# Patient Record
Sex: Female | Born: 1945 | Hispanic: No | Marital: Married | Smoking: Never smoker
Health system: Southern US, Community
[De-identification: ages and names within clinical notes are randomized; demographics above are authoritative.]

## PROBLEM LIST (undated history)

## (undated) DIAGNOSIS — K5790 Diverticulosis of intestine, part unspecified, without perforation or abscess without bleeding: Secondary | ICD-10-CM

## (undated) DIAGNOSIS — K529 Noninfective gastroenteritis and colitis, unspecified: Secondary | ICD-10-CM

## (undated) DIAGNOSIS — M25551 Pain in right hip: Secondary | ICD-10-CM

## (undated) HISTORY — PX: FOOT SURGERY: SHX648

## (undated) HISTORY — DX: Noninfective gastroenteritis and colitis, unspecified: K52.9

## (undated) HISTORY — PX: FINGER SURGERY: SHX640

## (undated) HISTORY — DX: Diverticulosis of intestine, part unspecified, without perforation or abscess without bleeding: K57.90

---

## 1964-10-09 HISTORY — PX: DILATION AND CURETTAGE OF UTERUS: SHX78

## 1997-10-09 HISTORY — PX: BREAST LUMPECTOMY: SHX2

## 2002-02-25 ENCOUNTER — Other Ambulatory Visit: Admission: RE | Admit: 2002-02-25 | Discharge: 2002-02-25 | Payer: Self-pay | Admitting: Obstetrics and Gynecology

## 2004-09-19 ENCOUNTER — Ambulatory Visit: Payer: Self-pay

## 2006-09-10 ENCOUNTER — Ambulatory Visit: Payer: Self-pay | Admitting: Obstetrics and Gynecology

## 2007-09-12 ENCOUNTER — Ambulatory Visit: Payer: Self-pay | Admitting: Obstetrics and Gynecology

## 2009-09-07 ENCOUNTER — Ambulatory Visit: Payer: Self-pay | Admitting: Internal Medicine

## 2009-10-09 HISTORY — PX: SPINE SURGERY: SHX786

## 2009-11-29 ENCOUNTER — Ambulatory Visit: Payer: Self-pay | Admitting: Internal Medicine

## 2010-07-14 ENCOUNTER — Encounter: Payer: Self-pay | Admitting: Internal Medicine

## 2010-08-09 ENCOUNTER — Encounter: Payer: Self-pay | Admitting: Internal Medicine

## 2010-10-09 DIAGNOSIS — K529 Noninfective gastroenteritis and colitis, unspecified: Secondary | ICD-10-CM

## 2010-10-09 HISTORY — DX: Noninfective gastroenteritis and colitis, unspecified: K52.9

## 2011-01-05 ENCOUNTER — Ambulatory Visit: Payer: Self-pay | Admitting: Family Medicine

## 2011-01-25 ENCOUNTER — Encounter: Payer: Self-pay | Admitting: Family Medicine

## 2011-02-07 ENCOUNTER — Encounter: Payer: Self-pay | Admitting: Family Medicine

## 2011-04-11 ENCOUNTER — Ambulatory Visit: Payer: Self-pay | Admitting: Gastroenterology

## 2011-04-14 LAB — PATHOLOGY REPORT

## 2011-06-27 ENCOUNTER — Telehealth: Payer: Self-pay | Admitting: Gastroenterology

## 2011-06-28 NOTE — Telephone Encounter (Signed)
Ok

## 2011-06-28 NOTE — Telephone Encounter (Signed)
Pt has been seen by Dr. Bluford Kaufmann in [place]. She was not pleased with the care she received there and is requesting to switch to Dr. Arlyce Dice. She states her primary care md suggested Dr. Arlyce Dice. Colon path report to Dr. Arlyce Dice to review. Please let me know if you will see this pt.

## 2011-06-29 ENCOUNTER — Telehealth: Payer: Self-pay | Admitting: Gastroenterology

## 2011-06-29 NOTE — Telephone Encounter (Signed)
Pt was seen by Dr. Bluford Kaufmann in [place]. Dr. Arlyce Dice has agreed to take her on as a pt. Pt is complaining of abdominal pain and diarrhea. Requesting to be seen. Pt scheduled to see Willette Cluster NP 06/30/11@9 :30am. Instructed pts husband to bring her medical records with them to the visit.

## 2011-06-30 ENCOUNTER — Other Ambulatory Visit: Payer: Self-pay | Admitting: Nurse Practitioner

## 2011-06-30 ENCOUNTER — Encounter: Payer: Self-pay | Admitting: Nurse Practitioner

## 2011-06-30 ENCOUNTER — Other Ambulatory Visit (INDEPENDENT_AMBULATORY_CARE_PROVIDER_SITE_OTHER): Payer: Medicare Other

## 2011-06-30 ENCOUNTER — Ambulatory Visit (INDEPENDENT_AMBULATORY_CARE_PROVIDER_SITE_OTHER): Payer: Medicare Other | Admitting: Nurse Practitioner

## 2011-06-30 VITALS — BP 128/72 | HR 60 | Ht 65.0 in | Wt 160.0 lb

## 2011-06-30 DIAGNOSIS — R1030 Lower abdominal pain, unspecified: Secondary | ICD-10-CM | POA: Insufficient documentation

## 2011-06-30 DIAGNOSIS — F458 Other somatoform disorders: Secondary | ICD-10-CM

## 2011-06-30 DIAGNOSIS — R109 Unspecified abdominal pain: Secondary | ICD-10-CM

## 2011-06-30 DIAGNOSIS — R198 Other specified symptoms and signs involving the digestive system and abdomen: Secondary | ICD-10-CM

## 2011-06-30 DIAGNOSIS — R0989 Other specified symptoms and signs involving the circulatory and respiratory systems: Secondary | ICD-10-CM

## 2011-06-30 DIAGNOSIS — K5289 Other specified noninfective gastroenteritis and colitis: Secondary | ICD-10-CM

## 2011-06-30 DIAGNOSIS — R194 Change in bowel habit: Secondary | ICD-10-CM

## 2011-06-30 DIAGNOSIS — K529 Noninfective gastroenteritis and colitis, unspecified: Secondary | ICD-10-CM

## 2011-06-30 DIAGNOSIS — K5790 Diverticulosis of intestine, part unspecified, without perforation or abscess without bleeding: Secondary | ICD-10-CM

## 2011-06-30 DIAGNOSIS — K5730 Diverticulosis of large intestine without perforation or abscess without bleeding: Secondary | ICD-10-CM

## 2011-06-30 DIAGNOSIS — R09A2 Foreign body sensation, throat: Secondary | ICD-10-CM

## 2011-06-30 DIAGNOSIS — D699 Hemorrhagic condition, unspecified: Secondary | ICD-10-CM

## 2011-06-30 LAB — URINALYSIS, ROUTINE W REFLEX MICROSCOPIC
Bilirubin Urine: NEGATIVE
Ketones, ur: NEGATIVE
Total Protein, Urine: NEGATIVE
Urine Glucose: NEGATIVE
Urobilinogen, UA: 0.2 (ref 0.0–1.0)
pH: 7 (ref 5.0–8.0)

## 2011-06-30 LAB — COMPREHENSIVE METABOLIC PANEL
Alkaline Phosphatase: 97 U/L (ref 39–117)
Creatinine, Ser: 1 mg/dL (ref 0.4–1.2)
GFR: 61.8 mL/min (ref >60.00)
Glucose, Bld: 84 mg/dL (ref 70–99)
Sodium: 139 mEq/L (ref 135–145)
Total Bilirubin: 1.6 mg/dL — ABNORMAL HIGH (ref 0.3–1.2)
Total Protein: 7.3 g/dL (ref 6.0–8.3)

## 2011-06-30 LAB — CBC WITH DIFFERENTIAL/PLATELET
Eosinophils Relative: 1.3 % (ref 0.0–5.0)
HCT: 46.3 % — ABNORMAL HIGH (ref 36.0–46.0)
Hemoglobin: 15.4 g/dL — ABNORMAL HIGH (ref 12.0–15.0)
Lymphs Abs: 0.9 10*3/uL (ref 0.7–4.0)
MCV: 87.1 fl (ref 78.0–100.0)
Monocytes Absolute: 0.4 10*3/uL (ref 0.1–1.0)
Neutro Abs: 6 10*3/uL (ref 1.4–7.7)
Platelets: 275 10*3/uL (ref 150.0–400.0)
RDW: 14.5 % (ref 11.5–14.6)
WBC: 7.4 10*3/uL (ref 4.5–10.5)

## 2011-06-30 LAB — SEDIMENTATION RATE: Sed Rate: 10 mm/hr (ref 0–22)

## 2011-06-30 MED ORDER — OMEPRAZOLE 20 MG PO CPDR: DELAYED_RELEASE_CAPSULE | ORAL | Status: DC

## 2011-06-30 NOTE — Progress Notes (Signed)
06/30/2011 Tammie Cervantes [id number] Feb 13, 1946   HISTORY OF PRESENT ILLNESS: Patient is a 65 year old white female, new to this practice, here for evaluation of colitis. Patient had a colon cancer screening in July of this year in [place].  She has been having some cramps, rectal pressure and loose stools (small volume) for 6 months but didn't think much of it until her colonoscopy showed colitis. Patient apparently had difficulty getting a follow up appointment to discuss test results and she has chosen not to return to the [facility].Since her colonoscopy patient has developed intermittent RLQ discomfort. The discomfort is unrelated to meals or defecation. Since her colonoscopy patient has been on a high fiber diet and her bowel movements have nearly returned to baseline .No weight loss. No skin rashes / lesions, no unusual joint pains.   Patient describes sensation of something being stuck in her throat but denies dysphagia. No heartburn. No history of significant GERD symptoms.    Past Medical History  Diagnosis Date  . Diverticulosis 2012    Colonoscopy--[facility]  . Colitis 2012    Colonoscopy--[facility]  . Back spasm   . Constipation    Past Surgical History  Procedure Date  . Breast lumpectomy 1999    Right  . Foot surgery     Right x 5  . Spine surgery 2011  . Dilation and curettage of uterus 1966    reports that she has never smoked. She has never used smokeless tobacco. She reports that she does not drink alcohol or use illicit drugs. family history includes Crohn's disease in her cousin and Heart disease in her mother.  There is no history of Colon cancer. Allergies  Allergen Reactions  . Betadine (Povidone Iodine)     Rash   . Compazine   . Epinephrine   . Iodinated Diagnostic Agents       Outpatient Encounter Prescriptions as of 06/30/2011  Medication Sig Dispense Refill  . AMBULATORY NON FORMULARY MEDICATION Iodine Supplement --- One by mouth once  daily       . AMBULATORY NON FORMULARY MEDICATION Potassium Tablet-- One tablet by mouth once daily       . Calcium-Magnesium-Vitamin D (CALCIUM MAGNESIUM PO) Take 1 capsule by mouth daily.        . Multiple Vitamin (MULTIVITAMIN PO) Take 1 capsule by mouth daily.        . Multiple Vitamins-Minerals (ZINC PO) Take 1 tablet by mouth daily.        . Omega-3 Fatty Acids (OMEGA 3 PO) Take 1 tablet by mouth daily.        . psyllium (METAMUCIL) 0.52 G capsule Take by mouth. 2-3 capsule by mouth twice a day       . VITAMIN D, CHOLECALCIFEROL, PO Take 1 capsule by mouth daily.         REVIEW OF SYSTEMS  : Positive for back pain, fatigue, frequent urination, urinary leakage. Positive for excessive bleeding with surgeries but denies easy bruisability. All other systems reviewed and negative except where noted in the History of Present Illness.   PHYSICAL EXAM: General: Well developed white female in no acute distress Head: Normocephalic and atraumatic Eyes:  sclerae anicteric,conjunctive pink. Ears: Normal auditory acuity Mouth: No deformity or lesions Neck: Supple, no masses.  Lungs: Clear throughout to auscultation Heart: Regular rate and rhythm; no murmurs heard Abdomen: Soft, non distended, non tender. No masses or hepatomegaly noted. Normal Bowel sounds Rectal: not done  Musculoskeletal: Symmetrical with no gross deformities  Skin: No lesions on visible extremities Extremities: No edema or deformities noted Neurological: Alert oriented x 4, grossly non focal Cervical Nodes:  No significant cervical adenopathy Psychological:  Alert and cooperative. Normal mood and affect  ASSESSMENT AND PLAN;

## 2011-07-01 DIAGNOSIS — K5790 Diverticulosis of intestine, part unspecified, without perforation or abscess without bleeding: Secondary | ICD-10-CM | POA: Insufficient documentation

## 2011-07-01 DIAGNOSIS — D699 Hemorrhagic condition, unspecified: Secondary | ICD-10-CM | POA: Insufficient documentation

## 2011-07-01 LAB — C-REACTIVE PROTEIN: CRP: 0.24 mg/dL (ref <0.60)

## 2011-07-01 NOTE — Assessment & Plan Note (Signed)
Please refer to "colitis"

## 2011-07-01 NOTE — Assessment & Plan Note (Signed)
Colonoscopy 04/11/11 in [place] revealed a localized area of mildlly eroded and inflamed sigmoid mucosa. Biopsies revealed marked, active colitis with features of chronicity but they were inconclusive with ischemia, infection and inflammatory bowel disease all in the list of possibilities. Stools nearly normal after initiation of fiber supplements but lower abdominal discomfort persists without any relationship to food or defecation. For further evaluation the patient will be scheduled for a flexible sigmoidoscopy, probably with repeat biopsies of the sigmoid colon. The risks, benefits, and alternatives to flexible sigmoidoscopy with possible biopsies were discussed with the patient and she consents to proceed.

## 2011-07-01 NOTE — Assessment & Plan Note (Signed)
Sigmoid diverticulosis on July 2012 colonoscopy.

## 2011-07-01 NOTE — Assessment & Plan Note (Signed)
Patient gives a history of abnormal bleeding with previous surgeries. Her INR was checked prior to colonoscopy in July, patient tells me it was normal.

## 2011-07-03 ENCOUNTER — Telehealth: Payer: Self-pay | Admitting: *Deleted

## 2011-07-03 ENCOUNTER — Encounter: Payer: Self-pay | Admitting: Nurse Practitioner

## 2011-07-03 DIAGNOSIS — R09A2 Foreign body sensation, throat: Secondary | ICD-10-CM | POA: Insufficient documentation

## 2011-07-03 DIAGNOSIS — R0989 Other specified symptoms and signs involving the circulatory and respiratory systems: Secondary | ICD-10-CM | POA: Insufficient documentation

## 2011-07-03 MED ORDER — CIPROFLOXACIN HCL 500 MG PO TABS: ORAL_TABLET | ORAL | Status: DC

## 2011-07-03 NOTE — Assessment & Plan Note (Signed)
New onset of lower abdominal discomfort, urinary pressure. This may be of gastrointestinal origin but need to exclude UTI. Will check UA and C&S.

## 2011-07-03 NOTE — Progress Notes (Signed)
Reviewed and agree with management. Robert D. Kaplan, M.D., FACG  

## 2011-07-03 NOTE — Patient Instructions (Addendum)
Please go to the basement level to have your labs drawn.  We have scheduled the Flexible Sigmoidoscopy with Dr. Melvia Heaps.  Directions and brochure provided.

## 2011-07-03 NOTE — Telephone Encounter (Signed)
Spoke with Willette Cluster, NP Re; Urine culture. Patient needs Cipro 500 mg po daily x 3 days. Spoke with patient and she is aware.

## 2011-07-03 NOTE — Assessment & Plan Note (Signed)
No dysphagia but feels like something stuck in her throat. This may be globus sensation secondary to GERD. Will try a PPI, if no improvement can evaluate further with upper endoscopy of barium swallow.

## 2011-07-04 ENCOUNTER — Telehealth: Payer: Self-pay | Admitting: Internal Medicine

## 2011-07-04 ENCOUNTER — Telehealth: Payer: Self-pay | Admitting: Gastroenterology

## 2011-07-04 MED ORDER — SULFAMETHOXAZOLE-TRIMETHOPRIM 800-160 MG PO TABS: 1.0000 | ORAL_TABLET | Freq: Two times a day (BID) | ORAL | Status: AC

## 2011-07-04 NOTE — Telephone Encounter (Signed)
Pt aware and script sent to pharmacy. 

## 2011-07-04 NOTE — Telephone Encounter (Signed)
I called and spoke with katie at [facility].  She says the patient did not want Cipro she wanted bactoban.  I have attempted to reach the patient and left a message for her to call back.  I have also left her a message that she should pick up Cipro and start on it due to UTI.

## 2011-07-04 NOTE — Telephone Encounter (Signed)
Bactrim 1 DS bid for 5 days

## 2011-07-04 NOTE — Telephone Encounter (Signed)
Pt was seen by Willette Cluster NP and given Cipro to take for a UTI. Pt states she cannot take the Cipro. Pt is requesting either Bactrim or Macrobid. Dr. Arlyce Dice please advise.

## 2011-07-05 NOTE — Telephone Encounter (Signed)
See additional phone from 07/04/11 patient changed to Bactrim

## 2011-07-10 ENCOUNTER — Other Ambulatory Visit: Payer: Medicare Other | Admitting: Gastroenterology

## 2011-07-10 ENCOUNTER — Telehealth: Payer: Self-pay | Admitting: Internal Medicine

## 2011-07-10 ENCOUNTER — Ambulatory Visit (AMBULATORY_SURGERY_CENTER): Payer: Medicare Other | Admitting: Gastroenterology

## 2011-07-10 ENCOUNTER — Encounter: Payer: Self-pay | Admitting: Gastroenterology

## 2011-07-10 DIAGNOSIS — R109 Unspecified abdominal pain: Secondary | ICD-10-CM

## 2011-07-10 DIAGNOSIS — K5730 Diverticulosis of large intestine without perforation or abscess without bleeding: Secondary | ICD-10-CM

## 2011-07-10 DIAGNOSIS — R198 Other specified symptoms and signs involving the digestive system and abdomen: Secondary | ICD-10-CM

## 2011-07-10 DIAGNOSIS — K5289 Other specified noninfective gastroenteritis and colitis: Secondary | ICD-10-CM

## 2011-07-10 MED ORDER — SODIUM CHLORIDE 0.9 % IV SOLN: 500.0000 mL | INTRAVENOUS | Status: DC

## 2011-07-10 MED ORDER — HYOSCYAMINE SULFATE 0.125 MG SL SUBL: 0.2500 mg | SUBLINGUAL_TABLET | SUBLINGUAL | Status: DC | PRN

## 2011-07-10 NOTE — Telephone Encounter (Signed)
I changed her Rx to the [facility] Please let [facility] know that Rx from earlier today is cancelled

## 2011-07-10 NOTE — Patient Instructions (Signed)
Diverticulosis Diverticulosis is a common condition that develops when small pouches (diverticula) form in the wall of the colon. The risk of diverticulosis increases with age. It happens more often in people who eat a low-fiber diet. Most individuals with diverticulosis have no symptoms. Those individuals with symptoms usually experience belly (abdominal) pain, constipation, or loose stools (diarrhea). HOME CARE INSTRUCTIONS  Increase the amount of fiber in your diet as directed by your caregiver or dietician. This may reduce symptoms of diverticulosis.   Your caregiver may recommend taking a dietary fiber supplement.   Drink at least 6 to 8 glasses of water each day to prevent constipation.   Try not to strain when you have a bowel movement.   Your caregiver may recommend avoiding nuts and seeds to prevent complications, although this is still an uncertain benefit.   Only take over-the-counter or prescription medicines for pain, discomfort, or fever as directed by your caregiver.  FOODS HAVING HIGH FIBER CONTENT INCLUDE:  Fruits. Apple, peach, pear, tangerine, raisins, prunes.   Vegetables. Brussels sprouts, asparagus, broccoli, cabbage, carrot, cauliflower, romaine lettuce, spinach, summer squash, tomato, winter squash, zucchini.   Starchy Vegetables. Baked beans, kidney beans, lima beans, split peas, lentils, potatoes (with skin).   Grains. Whole wheat bread, brown rice, bran flake cereal, plain oatmeal, white rice, shredded wheat, bran muffins.  SEEK IMMEDIATE MEDICAL CARE IF:  You develop increasing pain or severe bloating.   You have an oral temperature above 100, not controlled by medicine.   You develop vomiting or bowel movements that are bloody or black.  Document Released: 06/22/2004 Document Re-Released: 03/15/2010 [employer] Patient Information 2011 [employer]. 

## 2011-07-11 ENCOUNTER — Telehealth: Payer: Self-pay | Admitting: *Deleted

## 2011-07-11 NOTE — Telephone Encounter (Signed)
Patient's number is the [facility].  There was no way to get in touch with the patient,  No home phone number listed.   No cell phone.

## 2011-08-21 ENCOUNTER — Encounter: Payer: Self-pay | Admitting: Gastroenterology

## 2011-08-21 ENCOUNTER — Ambulatory Visit (INDEPENDENT_AMBULATORY_CARE_PROVIDER_SITE_OTHER): Payer: Medicare Other | Admitting: Gastroenterology

## 2011-08-21 DIAGNOSIS — K5289 Other specified noninfective gastroenteritis and colitis: Secondary | ICD-10-CM

## 2011-08-21 DIAGNOSIS — K529 Noninfective gastroenteritis and colitis, unspecified: Secondary | ICD-10-CM

## 2011-08-21 DIAGNOSIS — R198 Other specified symptoms and signs involving the digestive system and abdomen: Secondary | ICD-10-CM

## 2011-08-21 DIAGNOSIS — R1030 Lower abdominal pain, unspecified: Secondary | ICD-10-CM

## 2011-08-21 DIAGNOSIS — R109 Unspecified abdominal pain: Secondary | ICD-10-CM

## 2011-08-21 DIAGNOSIS — R194 Change in bowel habit: Secondary | ICD-10-CM

## 2011-08-21 MED ORDER — HYOSCYAMINE SULFATE ER 0.375 MG PO TBCR: EXTENDED_RELEASE_TABLET | ORAL | Status: DC

## 2011-08-21 MED ORDER — ALIGN 4 MG PO CAPS: 4.0000 mg | ORAL_CAPSULE | ORAL | Status: DC

## 2011-08-21 NOTE — Assessment & Plan Note (Signed)
Plan no further workup or therapy.

## 2011-08-21 NOTE — Assessment & Plan Note (Signed)
Pain is nonspecific and probably due to spasm.  Recommendations #1 switch to hyomax 0.375 mg twice a day as needed

## 2011-08-21 NOTE — Patient Instructions (Signed)
You will need to take Align once daily for 1 week, samples given

## 2011-08-21 NOTE — Progress Notes (Signed)
History of Present Illness:  Tammie Cervantes has returned for reevaluation of abdominal pain. She took Bactrim for a urinary tract infection. At this time she has 3-4 rather well-formed stools daily. She frankly denies diarrhea. Her main complaint is intermittent left and right lower quadrant pain. She took hyomax sublingually on only 2 occasions and thinks she may have had some relief.    Review of Systems: Pertinent positive and negative review of systems were noted in the above HPI section. All other review of systems were otherwise negative.    Current Medications, Allergies, Past Medical History, Past Surgical History, Family History and Social History were reviewed in [employer] electronic medical record  Vital signs were reviewed in today's medical record. Physical Exam: General: Well developed , well nourished, no acute distress

## 2011-08-21 NOTE — Assessment & Plan Note (Addendum)
Symptoms are fairly minimal and probably related to antibiotic use.  Recommendations #1 trial of align 1 tab daily for 1-2 weeks

## 2013-04-03 ENCOUNTER — Emergency Department (HOSPITAL_BASED_OUTPATIENT_CLINIC_OR_DEPARTMENT_OTHER): Payer: Medicare Other

## 2013-04-03 ENCOUNTER — Encounter (HOSPITAL_BASED_OUTPATIENT_CLINIC_OR_DEPARTMENT_OTHER): Payer: Self-pay

## 2013-04-03 ENCOUNTER — Emergency Department (HOSPITAL_BASED_OUTPATIENT_CLINIC_OR_DEPARTMENT_OTHER)
Admission: EM | Admit: 2013-04-03 | Discharge: 2013-04-03 | Disposition: A | Discharge status: Home / Self Care | Payer: Medicare Other | Attending: Emergency Medicine | Admitting: Emergency Medicine

## 2013-04-03 DIAGNOSIS — Z8719 Personal history of other diseases of the digestive system: Secondary | ICD-10-CM | POA: Insufficient documentation

## 2013-04-03 DIAGNOSIS — Y929 Unspecified place or not applicable: Secondary | ICD-10-CM | POA: Insufficient documentation

## 2013-04-03 DIAGNOSIS — S61319A Laceration without foreign body of unspecified finger with damage to nail, initial encounter: Secondary | ICD-10-CM

## 2013-04-03 DIAGNOSIS — Y939 Activity, unspecified: Secondary | ICD-10-CM | POA: Insufficient documentation

## 2013-04-03 DIAGNOSIS — W230XXA Caught, crushed, jammed, or pinched between moving objects, initial encounter: Secondary | ICD-10-CM | POA: Insufficient documentation

## 2013-04-03 DIAGNOSIS — S61209A Unspecified open wound of unspecified finger without damage to nail, initial encounter: Secondary | ICD-10-CM | POA: Insufficient documentation

## 2013-04-03 DIAGNOSIS — Z79899 Other long term (current) drug therapy: Secondary | ICD-10-CM | POA: Insufficient documentation

## 2013-04-03 DIAGNOSIS — S62639B Displaced fracture of distal phalanx of unspecified finger, initial encounter for open fracture: Secondary | ICD-10-CM

## 2013-04-03 MED ORDER — TETANUS-DIPHTH-ACELL PERTUSSIS 5-2.5-18.5 LF-MCG/0.5 IM SUSP
0.5000 mL | Freq: Once | INTRAMUSCULAR | Status: DC
  Filled 2013-04-03: qty 0.5

## 2013-04-03 MED ORDER — HYDROCODONE-ACETAMINOPHEN 5-325 MG PO TABS: 1.0000 | ORAL_TABLET | Freq: Four times a day (QID) | ORAL | Status: DC | PRN

## 2013-04-03 MED ORDER — HYDROCODONE-ACETAMINOPHEN 5-325 MG PO TABS
1.0000 | ORAL_TABLET | Freq: Once | ORAL | Status: AC
  Administered 2013-04-03: 1 via ORAL
  Filled 2013-04-03: qty 1

## 2013-04-03 MED ORDER — CEPHALEXIN 500 MG PO CAPS: 500.0000 mg | ORAL_CAPSULE | Freq: Four times a day (QID) | ORAL | Status: DC

## 2013-04-03 NOTE — ED Notes (Signed)
Patient ambulatory to Xray.

## 2013-04-03 NOTE — ED Notes (Signed)
Left 3rd finger injury-closed in car door

## 2013-04-03 NOTE — ED Provider Notes (Addendum)
History    CSN: [id number] Arrival date & time 04/03/13  1952  First MD Initiated Contact with Patient 04/03/13 2056     Chief Complaint  Patient presents with  . Finger Injury   (Consider location/radiation/quality/duration/timing/severity/associated sxs/prior Treatment) Patient is a 67 y.o. female presenting with hand pain. The history is provided by the patient.  Hand Pain This is a new problem. The current episode started less than 1 hour ago. The problem occurs constantly. The problem has not changed since onset.Associated symptoms comments: Slammed finger in the car door. Exacerbated by: movement. The symptoms are relieved by ice. Treatments tried: ice. The treatment provided mild relief.   Past Medical History  Diagnosis Date  . Colitis 2012    Colonoscopy--[facility]  . Constipation   . Diverticulosis     Colonoscopy--[facility]   Past Surgical History  Procedure Laterality Date  . Breast lumpectomy  1999    Right  . Foot surgery      Right x 5  . Spine surgery  2011  . Dilation and curettage of uterus  1966   Family History  Problem Relation Age of Onset  . Crohn's disease Cousin     Maternal side   . Heart disease Mother   . Colon cancer Neg Hx    History  Substance Use Topics  . Smoking status: Never Smoker   . Smokeless tobacco: Never Used  . Alcohol Use: No   OB History   Grav Para Term Preterm Abortions TAB SAB Ect Mult Living                 Review of Systems  All other systems reviewed and are negative.    Allergies  Bactrim; Betadine; Compazine; and Epinephrine  Home Medications   Current Outpatient Rx  Name  Route  Sig  Dispense  Refill  . AMBULATORY NON FORMULARY MEDICATION      Iodine Supplement --- One by mouth once daily          . AMBULATORY NON FORMULARY MEDICATION      Potassium Tablet-- One tablet by mouth once daily          . Calcium-Magnesium-Vitamin D (CALCIUM MAGNESIUM PO)   Oral   Take 1 capsule by mouth daily.            Marland Kitchen EXPIRED: hyoscyamine (HYOMAX-SL) 0.125 MG SL tablet   Sublingual   Place 2 tablets (0.25 mg total) under the tongue every 4 (four) hours as needed for cramping.   20 tablet   1   . Hyoscyamine Sulfate 0.375 MG TBCR      Take one tab twice a day as needed for abdominal pain   25 each   1   . Multiple Vitamin (MULTIVITAMIN PO)   Oral   Take 1 capsule by mouth daily.           . Multiple Vitamins-Minerals (ZINC PO)   Oral   Take 1 tablet by mouth daily.           . Omega-3 Fatty Acids (OMEGA 3 PO)   Oral   Take 1 tablet by mouth daily.           Marland Kitchen omeprazole (PRILOSEC) 20 MG capsule      Take 1 capsule 30 min before breakfast.   30 capsule   1   . Probiotic Product (ALIGN) 4 MG CAPS   Oral   Take 4 mg by mouth 1 day or 1 dose.  30 capsule   0     Samples 2 boxes given lot [id number] expire 10/201 ...   . psyllium (METAMUCIL) 0.52 G capsule   Oral   Take by mouth. 2-3 capsule by mouth twice a day          . VITAMIN D, CHOLECALCIFEROL, PO   Oral   Take 1 capsule by mouth daily.            BP 155/89  Pulse 81  Temp(Src) 97.9 F (36.6 C) (Oral)  Resp 18  SpO2 99% Physical Exam  Nursing note and vitals reviewed. Constitutional: She is oriented to person, place, and time. She appears well-developed and well-nourished. No distress.  HENT:  Head: Normocephalic and atraumatic.  Eyes: EOM are normal. Pupils are equal, round, and reactive to light.  Musculoskeletal:       Hands: Laceration through the left 3rd finger nailbed with nailbed laceration and partial nail avulsion.  NV intact and no tendon injury.  Normal sensation of the distal finger.  Neurological: She is alert and oriented to person, place, and time.  Skin: Skin is warm. No rash noted. No erythema.  Psychiatric: She has a normal mood and affect. Her behavior is normal.    ED Course  Procedures (including critical care time) Labs Reviewed - No data to display Dg Finger  Middle Left  04/03/2013   *RADIOLOGY REPORT*  Clinical Data: Left middle finger closed in door.  LEFT MIDDLE FINGER 2+V  Comparison: None.  Findings: There is a fracture through the tip of the left middle finger distal phalanx.  No additional fracture.  Overlying soft tissue swelling.  Joint spaces are maintained.  IMPRESSION: Left middle finger tuft fracture.   Original Report Authenticated By: Charlett Nose, M.D.   LACERATION REPAIR Performed by: Gwyneth Sprout Authorized by: Gwyneth Sprout Consent: Verbal consent obtained. Risks and benefits: risks, benefits and alternatives were discussed Consent given by: patient Patient identity confirmed: provided demographic data Prepped and Draped in normal sterile fashion Wound explored  Laceration Location: left middle finger - nailbed  Laceration Length: 2cm  No Foreign Bodies seen or palpated  Anesthesia: digital block  Local anesthetic: lidocaine 1% without epinephrine  Anesthetic total: 2 ml  Irrigation method: syringe Amount of cleaning: standard  Skin closure: 4.0 Vicryl   Number of sutures: 8   Technique: Simple interrupted   Patient tolerance: Patient tolerated the procedure well with no immediate complications.  1. Open fracture of tuft of distal phalanx of finger, initial encounter   2. Nailbed laceration, finger, initial encounter     MDM   Pt with nailbed laceration, fingernail avulsion and tuft fx.  Wound repaired as above and tetanus updated.  Pt placed on keflex and to f/u with hand surgery.  Gwyneth Sprout, MD 04/03/13 [id number]  Gwyneth Sprout, MD 04/03/13 2216

## 2013-04-03 NOTE — ED Notes (Signed)
Patient reports hx of reaction to TDaP vaccination in past. States "cellulitis type reaction at injection site". MD notified. Vaccination not given.

## 2013-04-03 NOTE — ED Notes (Signed)
Pt back from radiology 

## 2013-04-03 NOTE — ED Notes (Signed)
Bloody nail bed noted-Finger dsg applied in triage and ice pack given-pt had finger in ice upon arrival

## 2013-04-03 NOTE — ED Notes (Signed)
MD at bedside. 

## 2014-01-23 IMAGING — CR DG FINGER MIDDLE 2+V*L*
3 series · 3 of 3 positions shown · non-contrast
Comparison: None.

CLINICAL DATA: Left middle finger closed in door.

LEFT MIDDLE FINGER 2+V

[x finger pa left]
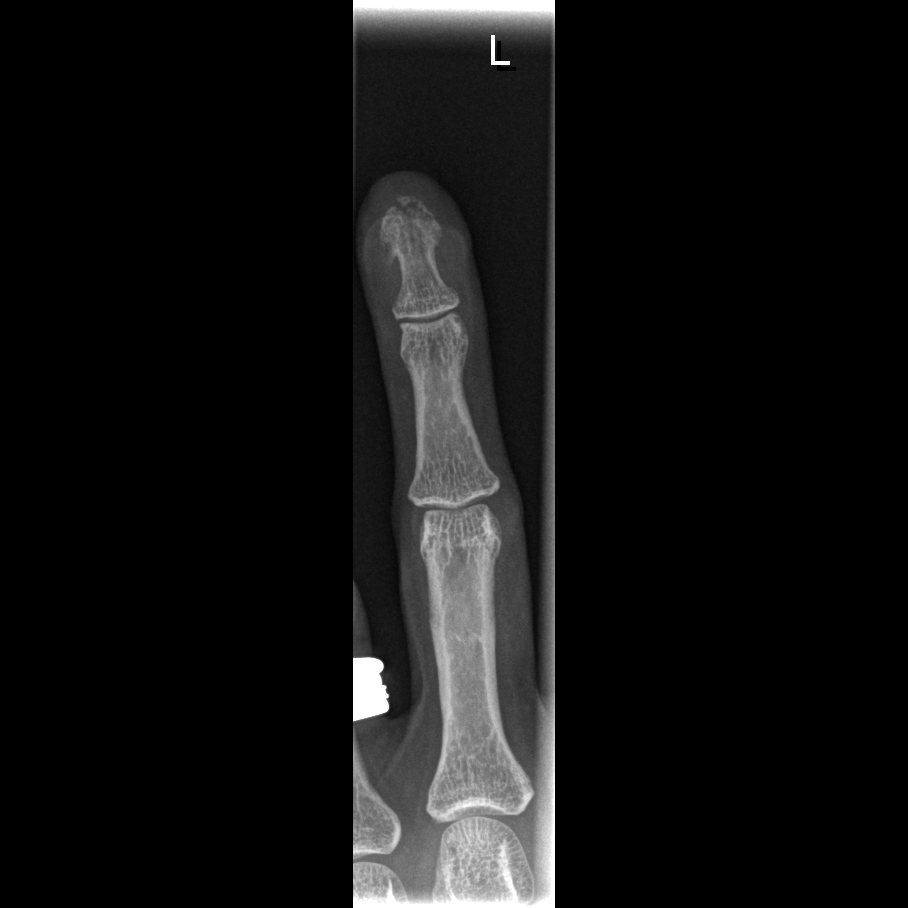

[x finger obl. left]
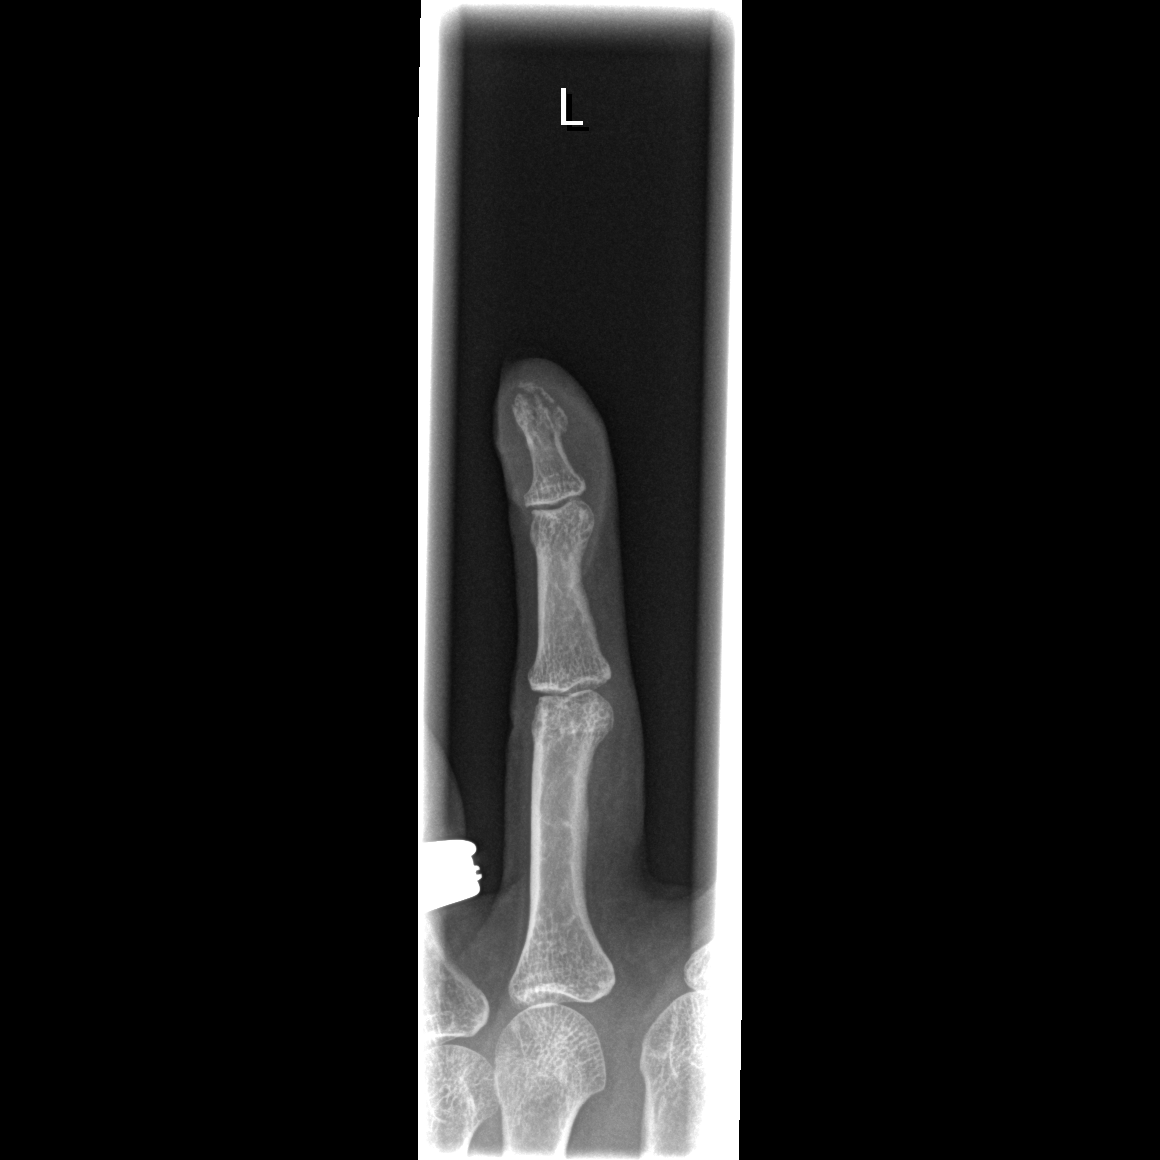

[x finger lateral left]
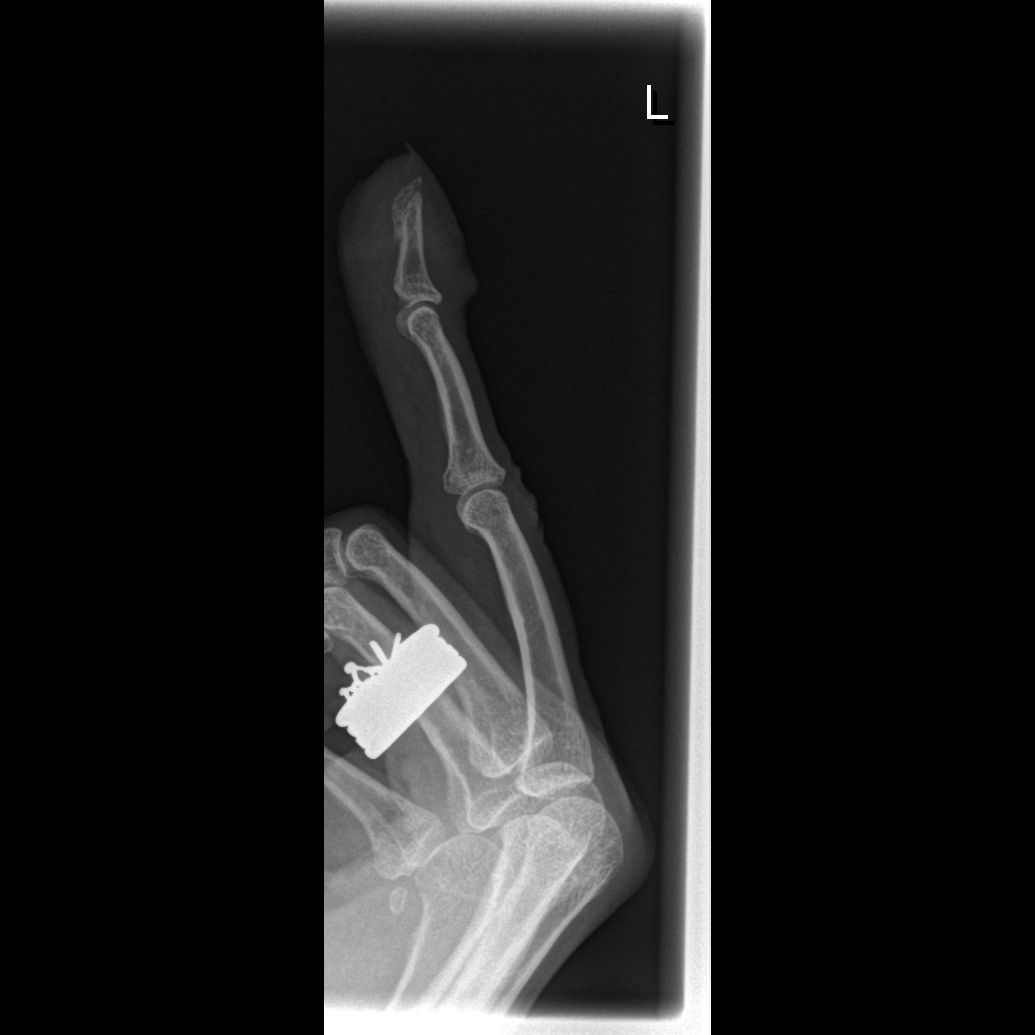

[3 of 3 positions shown; findings below may reference images not displayed]

FINDINGS: There is a fracture through the tip of the left middle
finger distal phalanx.  No additional fracture.  Overlying soft
tissue swelling.  Joint spaces are maintained.
IMPRESSION: Left middle finger tuft fracture.

## 2014-03-16 ENCOUNTER — Encounter: Payer: Self-pay | Admitting: Podiatry

## 2014-03-16 ENCOUNTER — Ambulatory Visit (INDEPENDENT_AMBULATORY_CARE_PROVIDER_SITE_OTHER): Payer: Medicare Other | Admitting: Podiatry

## 2014-03-16 VITALS — BP 140/95 | HR 81 | Ht 66.0 in | Wt 168.0 lb

## 2014-03-16 DIAGNOSIS — M79606 Pain in leg, unspecified: Secondary | ICD-10-CM

## 2014-03-16 DIAGNOSIS — Q828 Other specified congenital malformations of skin: Secondary | ICD-10-CM

## 2014-03-16 NOTE — Patient Instructions (Signed)
Seen for painful lesion on right. Trimmed callus like lesion. Return when lesion or pain comes back.

## 2014-03-16 NOTE — Progress Notes (Signed)
Subjective: 68 year old female presents complaining of painful lump x a few weeks under 5th MPJ area right foot. No incident of stepping on object.   Objective: Examination reveal porokeratotic lesion under 5th MPJ area right foot. No edema or erythema noted. Neurovascular status are within normal.  Assessment: Painful porokeratosis sub 5 right. R/O Foreign body.  Plan: Debrided porokeratotic lesion.  Pain relieved.

## 2014-03-17 ENCOUNTER — Ambulatory Visit: Payer: Self-pay | Admitting: Podiatry

## 2014-05-05 ENCOUNTER — Emergency Department (HOSPITAL_BASED_OUTPATIENT_CLINIC_OR_DEPARTMENT_OTHER)
Admission: EM | Admit: 2014-05-05 | Discharge: 2014-05-05 | Disposition: A | Discharge status: Home / Self Care | Payer: Medicare Other | Attending: Emergency Medicine | Admitting: Emergency Medicine

## 2014-05-05 ENCOUNTER — Emergency Department (HOSPITAL_BASED_OUTPATIENT_CLINIC_OR_DEPARTMENT_OTHER): Payer: Medicare Other

## 2014-05-05 ENCOUNTER — Encounter (HOSPITAL_BASED_OUTPATIENT_CLINIC_OR_DEPARTMENT_OTHER): Payer: Self-pay | Admitting: Emergency Medicine

## 2014-05-05 DIAGNOSIS — Z8719 Personal history of other diseases of the digestive system: Secondary | ICD-10-CM | POA: Diagnosis not present

## 2014-05-05 DIAGNOSIS — Z79899 Other long term (current) drug therapy: Secondary | ICD-10-CM | POA: Insufficient documentation

## 2014-05-05 DIAGNOSIS — R109 Unspecified abdominal pain: Secondary | ICD-10-CM | POA: Insufficient documentation

## 2014-05-05 DIAGNOSIS — R1011 Right upper quadrant pain: Secondary | ICD-10-CM | POA: Diagnosis not present

## 2014-05-05 DIAGNOSIS — R110 Nausea: Secondary | ICD-10-CM

## 2014-05-05 LAB — COMPREHENSIVE METABOLIC PANEL
ALBUMIN: 4.2 g/dL (ref 3.5–5.2)
ALT: 21 U/L (ref 0–35)
AST: 26 U/L (ref 0–37)
Alkaline Phosphatase: 118 U/L — ABNORMAL HIGH (ref 39–117)
Anion gap: 15 (ref 5–15)
BUN: 20 mg/dL (ref 6–23)
CALCIUM: 9.3 mg/dL (ref 8.4–10.5)
CO2: 24 mEq/L (ref 19–32)
CREATININE: 0.9 mg/dL (ref 0.50–1.10)
Chloride: 102 mEq/L (ref 96–112)
GFR calc Af Amer: 74 mL/min — ABNORMAL LOW (ref >90)
GFR, EST NON AFRICAN AMERICAN: 64 mL/min — AB (ref >90)
Glucose, Bld: 107 mg/dL — ABNORMAL HIGH (ref 70–99)
Potassium: 4.2 mEq/L (ref 3.7–5.3)
SODIUM: 141 meq/L (ref 137–147)
TOTAL PROTEIN: 7.6 g/dL (ref 6.0–8.3)
Total Bilirubin: 1.1 mg/dL (ref 0.3–1.2)

## 2014-05-05 LAB — CBC
HCT: 47.1 % — ABNORMAL HIGH (ref 36.0–46.0)
Hemoglobin: 16 g/dL — ABNORMAL HIGH (ref 12.0–15.0)
MCH: 28.6 pg (ref 26.0–34.0)
MCHC: 34 g/dL (ref 30.0–36.0)
MCV: 84.3 fL (ref 78.0–100.0)
PLATELETS: 261 10*3/uL (ref 150–400)
RBC: 5.59 MIL/uL — ABNORMAL HIGH (ref 3.87–5.11)
RDW: 14.8 % (ref 11.5–15.5)
WBC: 3.8 10*3/uL — ABNORMAL LOW (ref 4.0–10.5)

## 2014-05-05 LAB — LIPASE, BLOOD: LIPASE: 65 U/L — AB (ref 11–59)

## 2014-05-05 MED ORDER — ONDANSETRON HCL 4 MG PO TABS: 4.0000 mg | ORAL_TABLET | Freq: Four times a day (QID) | ORAL | Status: DC

## 2014-05-05 NOTE — ED Notes (Signed)
Patient states one week ago she attended a fish fry and ate fried foods.  States she developed right upper quadrant abdominal pain with nausea, which has persisted all week.  States she has a history of colitis, and pain is similar, but is usually located in the her left lower quadrant.  BM's have been normal.

## 2014-05-05 NOTE — ED Provider Notes (Signed)
CSN: [id number]     Arrival date & time 05/05/14  1056 History   First MD Initiated Contact with Patient 05/05/14 1143     Chief Complaint  Patient presents with  . Abdominal Pain     (Consider location/radiation/quality/duration/timing/severity/associated sxs/prior Treatment) HPI Pt presenting with c/o right upper abdominal pain and nausea, she states symptoms have been bothering her over the past week since eating fried food.  No vomiting but has felt nauseated.  She has hx of chronic colitis but no change in her usual lower abdominal discomfort.  No fever/chills.  No changes in stool.  No dysuria.  Symptoms are constant over the past week.  She has been eating less which seems to help her symptoms.  Has continued to drink liquids well.  Discharged with strict return precautions.  Pt agreeable with plan. Past Medical History  Diagnosis Date  . Colitis 2012    Colonoscopy--[facility]  . Diverticulosis     Colonoscopy--[facility]   Past Surgical History  Procedure Laterality Date  . Breast lumpectomy  1999    Right  . Foot surgery      Right x 5  . Spine surgery  2011  . Dilation and curettage of uterus  1966  . Finger surgery     Family History  Problem Relation Age of Onset  . Crohn's disease Cousin     Maternal side   . Heart disease Mother   . Colon cancer Neg Hx    History  Substance Use Topics  . Smoking status: Never Smoker   . Smokeless tobacco: Never Used  . Alcohol Use: No   OB History   Grav Para Term Preterm Abortions TAB SAB Ect Mult Living                 Review of Systems ROS reviewed and all otherwise negative except for mentioned in HPI    Allergies  Bactrim; Betadine; Compazine; and Epinephrine  Home Medications   Prior to Admission medications   Medication Sig Start Date End Date Taking? Authorizing Provider  Calcium-Magnesium-Vitamin D (CALCIUM MAGNESIUM PO) Take 1 capsule by mouth daily.     Yes Historical Provider, MD  Multiple Vitamin  (MULTIVITAMIN PO) Take 1 capsule by mouth daily.     Yes Historical Provider, MD  Probiotic Product (PROBIOTIC DAILY PO) Take by mouth.   Yes Historical Provider, MD  AMBULATORY NON FORMULARY MEDICATION Iodine Supplement --- One by mouth once daily     Historical Provider, MD  AMBULATORY NON FORMULARY MEDICATION Potassium Tablet-- One tablet by mouth once daily     Historical Provider, MD  hyoscyamine (HYOMAX-SL) 0.125 MG SL tablet Place 2 tablets (0.25 mg total) under the tongue every 4 (four) hours as needed for cramping. 07/10/11 07/20/11  Iva Boop, MD  Hyoscyamine Sulfate 0.375 MG TBCR Take one tab twice a day as needed for abdominal pain 08/21/11   Louis Meckel, MD  Multiple Vitamins-Minerals (ZINC PO) Take 1 tablet by mouth daily.      Historical Provider, MD  Omega-3 Fatty Acids (OMEGA 3 PO) Take 1 tablet by mouth daily.      Historical Provider, MD  omeprazole (PRILOSEC) 20 MG capsule Take 1 capsule 30 min before breakfast. 06/30/11   Meredith Pel, NP  ondansetron (ZOFRAN) 4 MG tablet Take 1 tablet (4 mg total) by mouth every 6 (six) hours. 05/05/14   Ethelda Chick, MD  Probiotic Product (ALIGN) 4 MG CAPS Take 4 mg by  mouth 1 day or 1 dose. 08/21/11   Inda Castle, MD  psyllium (METAMUCIL) 0.52 G capsule Take by mouth. 2-3 capsule by mouth twice a day     Historical Provider, MD  VITAMIN D, CHOLECALCIFEROL, PO Take 1 capsule by mouth daily.      Historical Provider, MD   BP 132/79  Pulse 85  Temp(Src) 98.4 F (36.9 C) (Oral)  Resp 20  Ht $R'5\' 6"'mZ$  (1.676 m)  Wt 165 lb (74.844 kg)  BMI 26.64 kg/m2  SpO2 97% Vitals reviewed Physical Exam Physical Examination: General appearance - alert, well appearing, and in no distress Mental status - alert, oriented to person, place, and time Eyes -  No conjunctival injection, no scleral icterus Mouth - mucous membranes moist, pharynx normal without lesions Chest - clear to auscultation, no wheezes, rales or rhonchi, symmetric air  entry Heart - normal rate, regular rhythm, normal S1, S2, no murmurs, rubs, clicks or gallops Abdomen - soft, nontender, nondistended, no masses or organomegaly, nabs Extremities - peripheral pulses normal, no pedal edema, no clubbing or cyanosis Skin - normal coloration and turgor, no rashes  ED Course  Procedures (including critical care time) Labs Review Labs Reviewed  CBC - Abnormal; Notable for the following:    WBC 3.8 (*)    RBC 5.59 (*)    Hemoglobin 16.0 (*)    HCT 47.1 (*)    All other components within normal limits  COMPREHENSIVE METABOLIC PANEL - Abnormal; Notable for the following:    Glucose, Bld 107 (*)    Alkaline Phosphatase 118 (*)    GFR calc non Af Amer 64 (*)    GFR calc Af Amer 74 (*)    All other components within normal limits  LIPASE, BLOOD - Abnormal; Notable for the following:    Lipase 65 (*)    All other components within normal limits    Imaging Review US Abdomen Complete  05/05/2014   CLINICAL DATA:  Right upper quadrant abdominal pain.  EXAM: ULTRASOUND ABDOMEN COMPLETE  COMPARISON:  None.  FINDINGS: Gallbladder:  No gallstones or wall thickening visualized. No sonographic Murphy sign noted.  Common bile duct:  Diameter: Measures 3 mm which is within normal limits.  Liver:  No focal lesion identified. Within normal limits in parenchymal echogenicity.  IVC:  No abnormality visualized.  Pancreas:  Visualized portion unremarkable.  Spleen:  Size and appearance within normal limits.  Right Kidney:  Length: 10.3 cm. Echogenicity within normal limits. No mass or hydronephrosis visualized.  Left Kidney:  Length: 10.6 cm. Echogenicity within normal limits. No mass or hydronephrosis visualized.  Abdominal aorta:  No aneurysm visualized.  Other findings:  None.  IMPRESSION: No abnormality seen in the abdomen.   Electronically Signed   By: Sabino Dick M.D.   On: 05/05/2014 14:41     EKG Interpretation None      MDM   Final diagnoses:  Right upper  quadrant pain  Nausea    Pt presenting with c/o nausea and upper abdominal discomfort after eating fried foods last week.  Alk phos is midly elevated as is lipase.  Ultrasound is reassuring.  Pt is not vomiting, no fever.  I have advised her to have labs rechecked in the next week, recehck sooner if she develops worsening pain, vomiting or fever.  Given information to obtain appointment with PMD.  Discharged with strict return precautions.  Pt agreeable with plan.  Nursing notes including past medical history and social history reviewed  and considered in documentation     Threasa Beards, MD 05/07/14 [phone]

## 2014-05-05 NOTE — Discharge Instructions (Signed)
Return to the ED with any concerns including vomiting and not able to keep down liquids, worsening abdominal pain, fever/chills, decreased level of alertness/lethargy, or any other alarming symptoms °

## 2014-06-16 ENCOUNTER — Ambulatory Visit: Payer: Medicare Other | Admitting: Internal Medicine

## 2014-08-24 ENCOUNTER — Ambulatory Visit (INDEPENDENT_AMBULATORY_CARE_PROVIDER_SITE_OTHER): Payer: Medicare Other | Admitting: Internal Medicine

## 2014-08-24 ENCOUNTER — Encounter: Payer: Self-pay | Admitting: Internal Medicine

## 2014-08-24 VITALS — BP 156/94 | HR 81 | Temp 98.4°F | Resp 16 | Ht 64.0 in | Wt 168.0 lb

## 2014-08-24 DIAGNOSIS — IMO0001 Reserved for inherently not codable concepts without codable children: Secondary | ICD-10-CM

## 2014-08-24 DIAGNOSIS — K5730 Diverticulosis of large intestine without perforation or abscess without bleeding: Secondary | ICD-10-CM

## 2014-08-24 DIAGNOSIS — R030 Elevated blood-pressure reading, without diagnosis of hypertension: Secondary | ICD-10-CM

## 2014-08-24 DIAGNOSIS — N941 Dyspareunia: Secondary | ICD-10-CM

## 2014-08-24 DIAGNOSIS — N6001 Solitary cyst of right breast: Secondary | ICD-10-CM

## 2014-08-24 DIAGNOSIS — G479 Sleep disorder, unspecified: Secondary | ICD-10-CM

## 2014-08-24 DIAGNOSIS — IMO0002 Reserved for concepts with insufficient information to code with codable children: Secondary | ICD-10-CM

## 2014-08-25 ENCOUNTER — Encounter: Payer: Self-pay | Admitting: *Deleted

## 2014-08-25 DIAGNOSIS — K5730 Diverticulosis of large intestine without perforation or abscess without bleeding: Secondary | ICD-10-CM | POA: Insufficient documentation

## 2014-08-25 DIAGNOSIS — IMO0002 Reserved for concepts with insufficient information to code with codable children: Secondary | ICD-10-CM | POA: Insufficient documentation

## 2014-08-25 DIAGNOSIS — N6009 Solitary cyst of unspecified breast: Secondary | ICD-10-CM | POA: Insufficient documentation

## 2014-08-25 DIAGNOSIS — G479 Sleep disorder, unspecified: Secondary | ICD-10-CM | POA: Insufficient documentation

## 2014-08-25 NOTE — Progress Notes (Signed)
   Subjective:    Patient ID: Tammie Cervantes, female    DOB: [birth year], 68 y.o.   MRN: [id number]  HPI Tammie Cervantes is a new pt for primary care.  She has not received continuity care since around 2011 and does not like going to doctors  [place] and works for [employer]   PMH of colitis (Dr. Bluford Cervantes and Dr. Arlyce Cervantes),  R breast cyst,  Menopause,  Diverticulosis, DJD.   She has also received several treatments from the [facility] [place].   Wellness care:  She does not wish a mm,  Last pap was around 2011 Dr. Andrey Cervantes,  Last mm 2011  She is also taking several OTC herbs includes tumeric and multiple other herbs.  She take thyroid med with iodine that is supposed to help boost thyroid fxn.   She is concerned about dyspareunia.  Husband says she snores and quits breathing at night.  Lots of vaginal dryness.  Has used olive oil and mulltiple moistruizers.    LMP 2001   Fh Mother had uterine CA and CVA  fahter hepatitis   Review of Systems See HPI    Objective:   Physical Exam Physical Exam  Nursing note and vitals reviewed.  Constitutional: She is oriented to person, place, and time. She appears well-developed and well-nourished.  HENT:  Head: Normocephalic and atraumatic.  Cardiovascular: Normal rate and regular rhythm. Exam reveals no gallop and no friction rub.  No murmur heard.  Pulmonary/Chest: Breath sounds normal. She has no wheezes. She has no rales.  Neurological: She is alert and oriented to person, place, and time.  Skin: Skin is warm and dry.  Psychiatric: She has a normal mood and affect. Her behavior is normal.             Assessment & Plan:  Dyspareunia  Pt has been w/o preventive care in quite some time.   I advised mm and pap but I am not sure pt will agree to this.  Will start with all labs.  I counseled pt .  That I would not give HT without her having mm , pap etc.  I did give samples of Luvena for her to try    Sleep disorder  Will discuss at later visit  R breast cyst per pt hisotyr   Colitis:   She was adviseed Hyosamine and Align bu Gi but she states she does not like Dr. Arlyce Cervantes

## 2014-09-15 ENCOUNTER — Encounter: Payer: Self-pay | Admitting: Podiatry

## 2014-09-15 ENCOUNTER — Ambulatory Visit (INDEPENDENT_AMBULATORY_CARE_PROVIDER_SITE_OTHER): Payer: Medicare Other | Admitting: Podiatry

## 2014-09-15 VITALS — BP 132/94 | HR 86

## 2014-09-15 DIAGNOSIS — M659 Synovitis and tenosynovitis, unspecified: Secondary | ICD-10-CM

## 2014-09-15 DIAGNOSIS — L608 Other nail disorders: Secondary | ICD-10-CM

## 2014-09-15 DIAGNOSIS — M205X1 Other deformities of toe(s) (acquired), right foot: Secondary | ICD-10-CM

## 2014-09-15 DIAGNOSIS — M65979 Unspecified synovitis and tenosynovitis, unspecified ankle and foot: Secondary | ICD-10-CM

## 2014-09-15 DIAGNOSIS — L609 Nail disorder, unspecified: Secondary | ICD-10-CM

## 2014-09-15 NOTE — Patient Instructions (Signed)
Seen for pain in right foot. May benefit from stretch exercise and custom shoe inserts. Return as needed.

## 2014-09-15 NOTE — Progress Notes (Signed)
Subjective: 68 year old female presents complaining of pain over 3rd metatarsal right foot. She has had foot surgery 4 years ago including removal of bunion and neuroma from 3rd interspace.   Objective: Examination reveal tenderness along the old scar over the 3rd Extensor digitorum longus tendon right foot.  Brown discoloration of right great toe. Mild hallux limitus right. Long medial skin incision right over the first ray.  No edema or erythema noted. Neurovascular status are within normal.  Assessment: Tenosynovitis over 3rd EDL under old incision. Subungual hematoma secondary to hallux limitus right.  Plan: Reviewed clinical findings and available options:  Stretch exercise of old scar over 3rd metatarsal shaft and first MPJ right foot. Patient will return for custom orthotics.  May benefit from custom orthotics.

## 2015-02-24 IMAGING — US US ABDOMEN COMPLETE
1 series · 14 of 25 positions shown · non-contrast
Comparison: None.

CLINICAL DATA: Right upper quadrant abdominal pain.

EXAM:
ULTRASOUND ABDOMEN COMPLETE

[Series 1: us abdomen complete · 0.32mm/px · 14 of 67 slices shown]
[im 1/67]
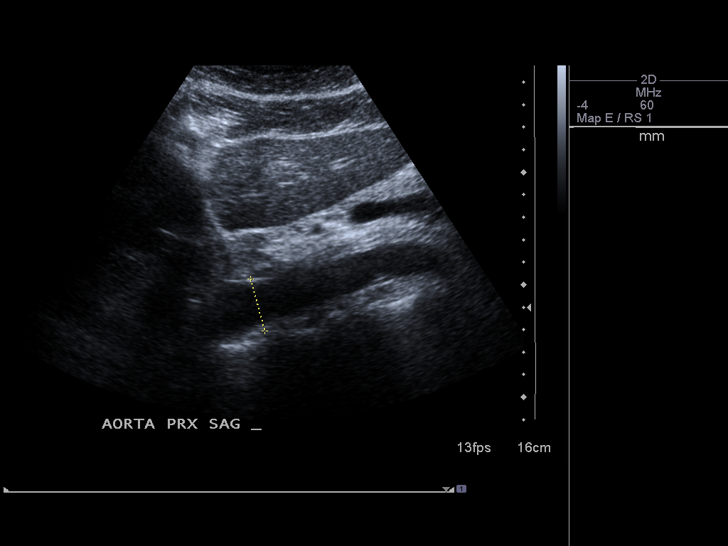
[im 6/67]
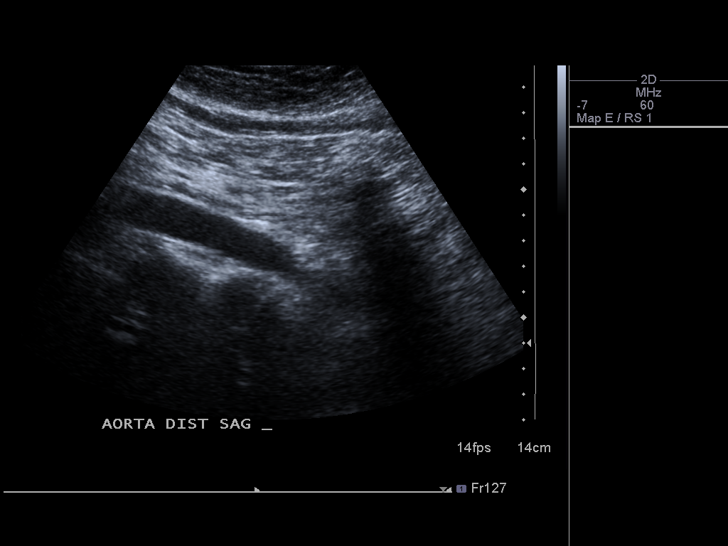
[im 12/67]
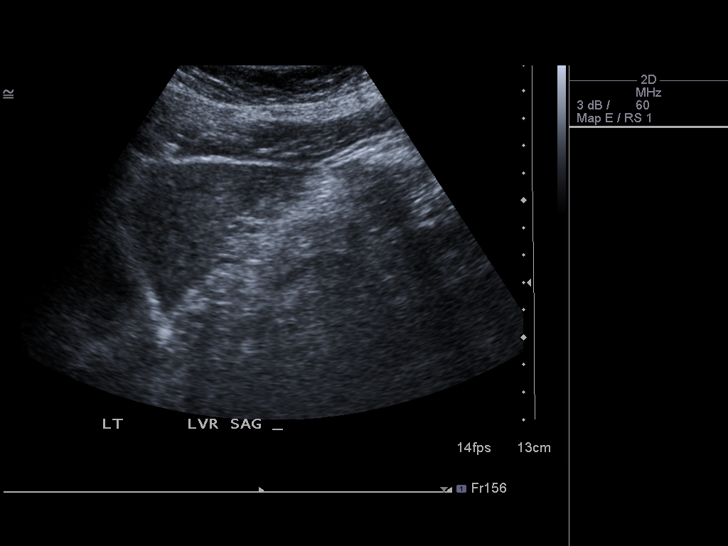
[im 17/67]
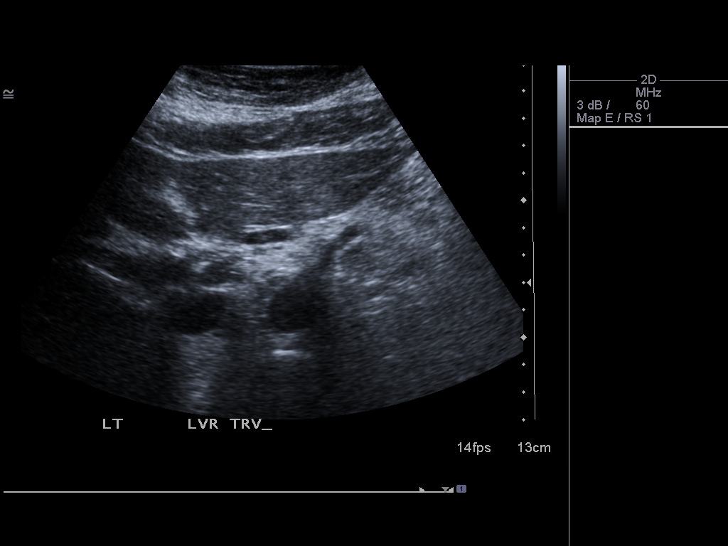
[im 23/67]
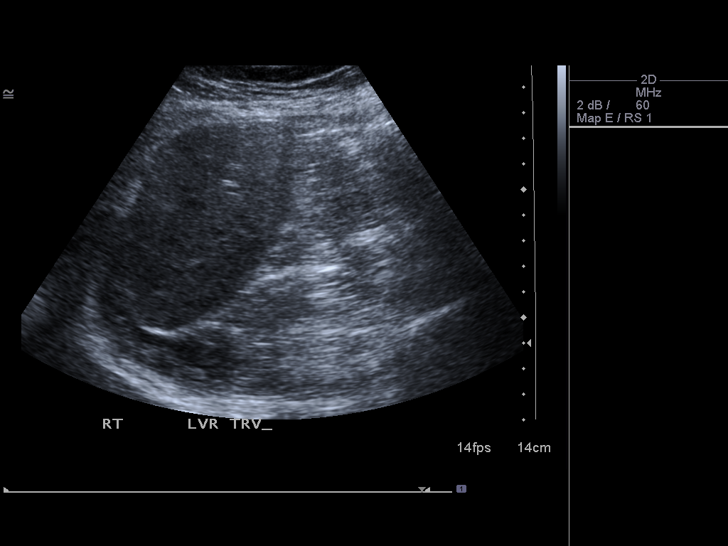
[im 25/67]
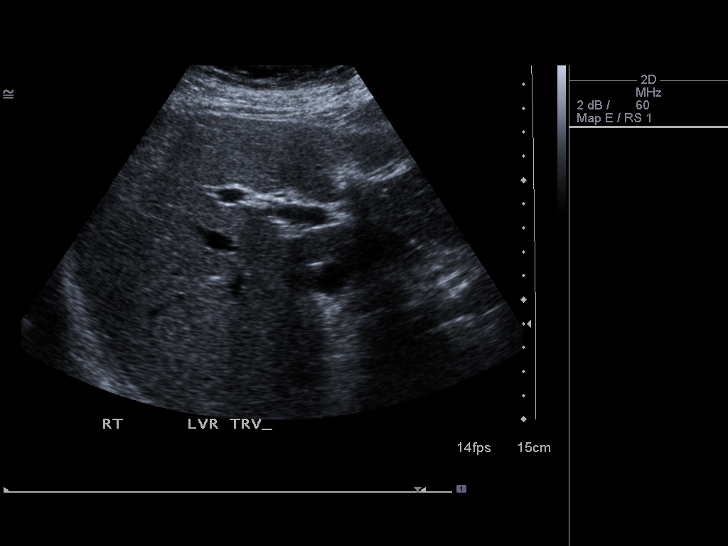
[im 31/67]
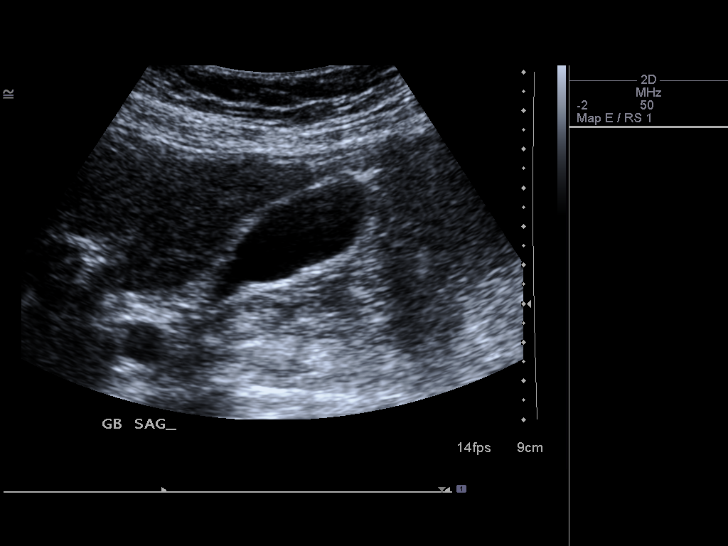
[im 36/67]
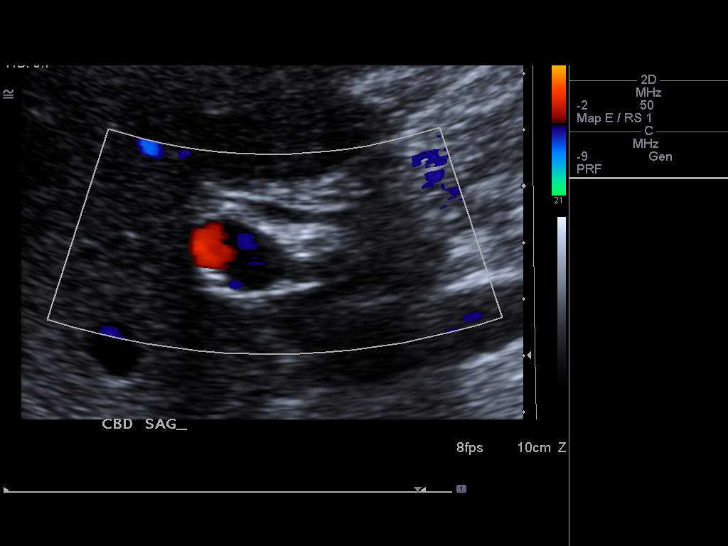
[im 42/67]
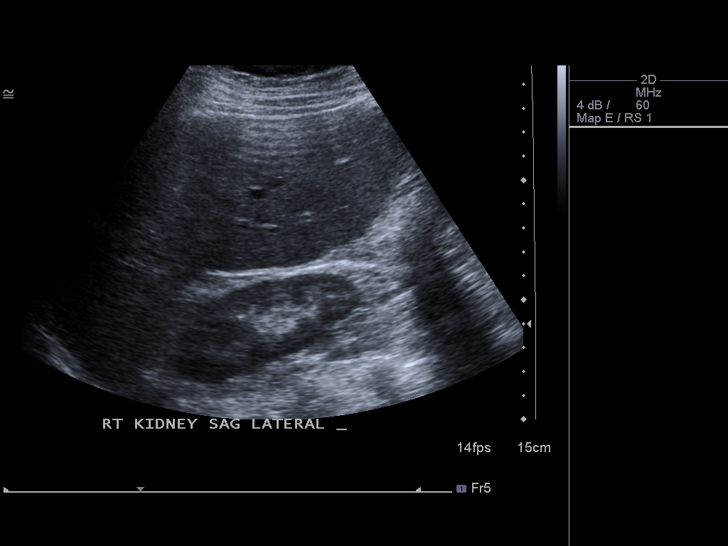
[im 45/67]
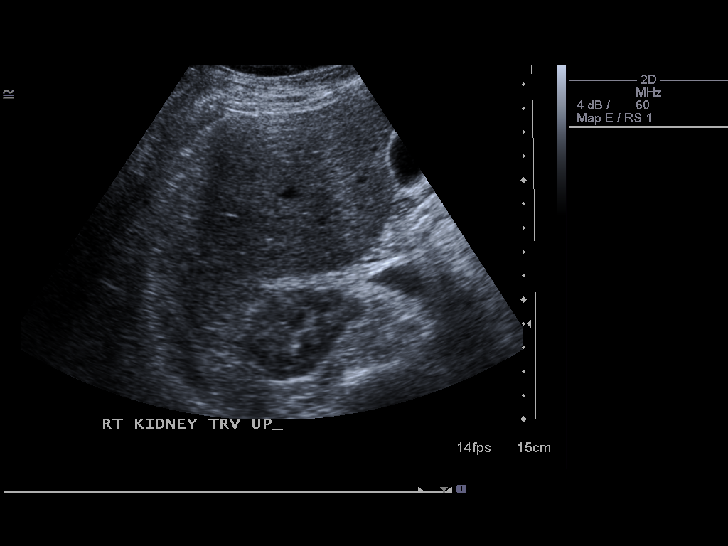
[im 50/67]
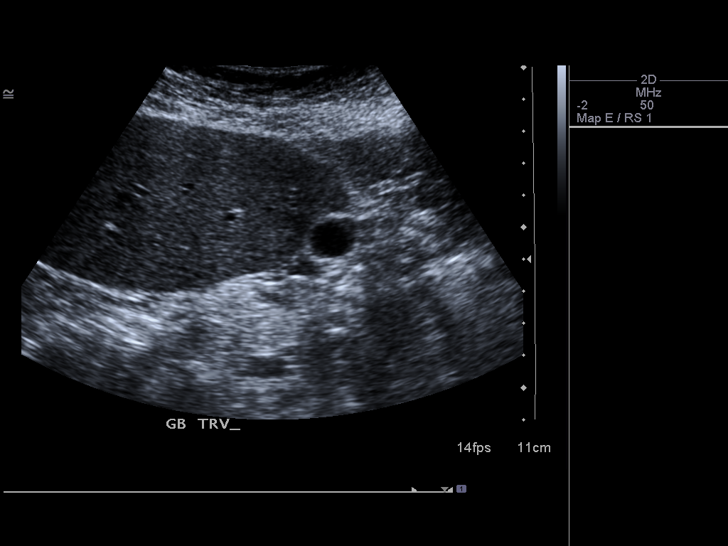
[im 56/67]
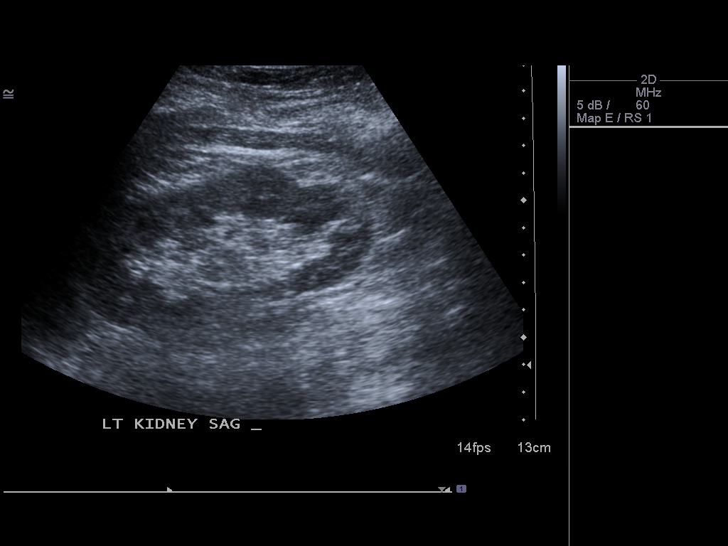
[im 61/67]
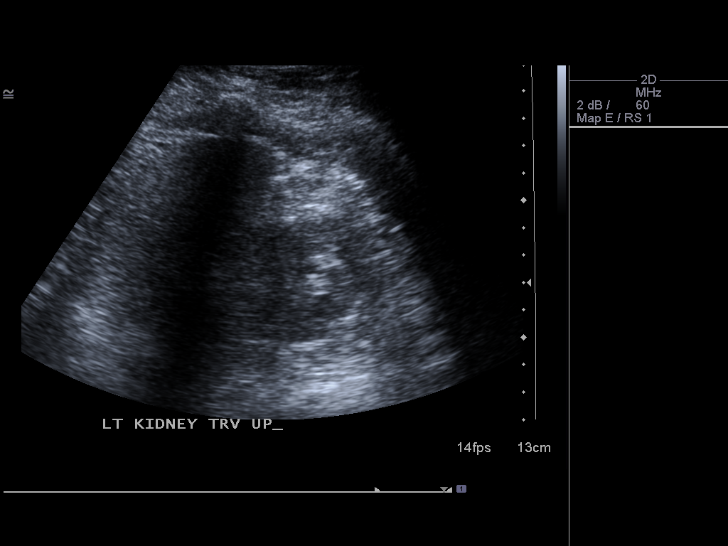
[im 67/67]
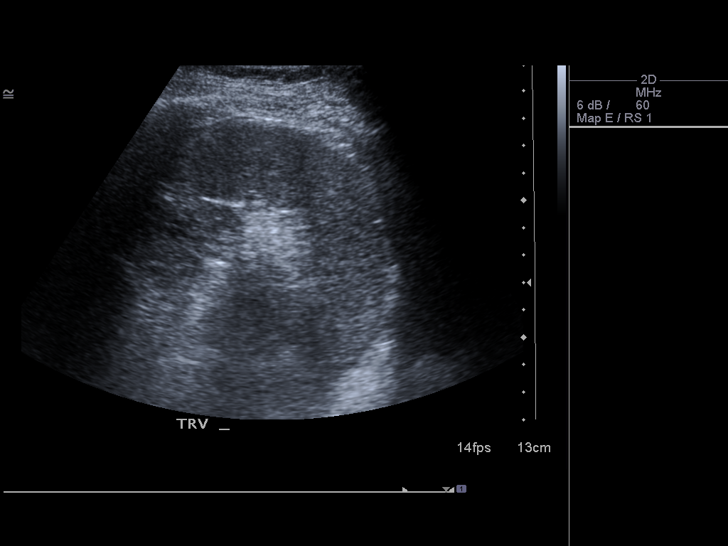

[14 of 25 positions shown; findings below may reference images not displayed]

FINDINGS: Gallbladder:

No gallstones or wall thickening visualized. No sonographic Murphy
sign noted.

Common bile duct:

Diameter: Measures 3 mm which is within normal limits.

Liver:

No focal lesion identified. Within normal limits in parenchymal
echogenicity.

IVC:

No abnormality visualized.

Pancreas:

Visualized portion unremarkable.

Spleen:

Size and appearance within normal limits.

Right Kidney:

Length: 10.3 cm. Echogenicity within normal limits. No mass or
hydronephrosis visualized.

Left Kidney:

Length: 10.6 cm. Echogenicity within normal limits. No mass or
hydronephrosis visualized.

Abdominal aorta:

No aneurysm visualized.

Other findings:

None.
IMPRESSION: No abnormality seen in the abdomen.

## 2016-02-24 ENCOUNTER — Inpatient Hospital Stay
Admit: 2016-02-24 | Discharge: 2016-02-24 | Disposition: A | Discharge status: Home / Self Care | Payer: MEDICARE | Attending: Family Medicine

## 2016-02-24 DIAGNOSIS — W57XXXA Bitten or stung by nonvenomous insect and other nonvenomous arthropods, initial encounter: Secondary | ICD-10-CM

## 2016-02-24 MED ORDER — DOXYCYCLINE 100 MG CAP
100 mg | ORAL_CAPSULE | Freq: Two times a day (BID) | ORAL | 0 refills | Status: AC
Start: 2016-02-24 — End: 2016-03-09

## 2016-02-24 NOTE — Other (Signed)
Patient is a 70 y.o. female presenting with Insect Bite. The history is provided by the patient.   Insect Bite   This is a new problem. The current episode started yesterday. The problem has been gradually worsening (developed local redness and local irritation at the bite side- on rt poplitial region). Pertinent negatives include no headaches. Associated symptoms comments: As above. Nothing aggravates the symptoms. Nothing relieves the symptoms. She has tried nothing for the symptoms.        History reviewed. No pertinent past medical history.     History reviewed. No pertinent surgical history.      History reviewed. No pertinent family history.     Social History     Social History   ??? Marital status: MARRIED     Spouse name: N/A   ??? Number of children: N/A   ??? Years of education: N/A     Occupational History   ??? Not on file.     Social History Main Topics   ??? Smoking status: Never Smoker   ??? Smokeless tobacco: Not on file   ??? Alcohol use Not on file   ??? Drug use: Not on file   ??? Sexual activity: Not on file     Other Topics Concern   ??? Not on file     Social History Narrative   ??? No narrative on file                ALLERGIES: Sulfa (sulfonamide antibiotics)    Review of Systems   Neurological: Negative for headaches.   All other systems reviewed and are negative.      Vitals:    02/24/16 0931   BP: 145/79   Pulse: 85   Resp: 20   Temp: 97.8 ??F (36.6 ??C)   SpO2: 97%   Weight: 73.9 kg (163 lb)   Height: 5\' 5"  (1.651 m)       Physical Exam   Constitutional: No distress.   Skin: No rash noted. There is erythema.        Nursing note and vitals reviewed.      MDM     Differential Diagnosis; Clinical Impression; Plan:     CLINICAL IMPRESSION:  Tick bite of lower leg, right, initial encounter  (primary encounter diagnosis)      DDX    Plan:    Doxycycline 100 mg bid x 14 days  Follow with PCP in 2-3 weeks for Lyme titer if develop any systemic sxs  Amount and/or Complexity of Data Reviewed:     Review and summarize past medical records:  Yes  Risk of Significant Complications, Morbidity, and/or Mortality:   Presenting problems:  Low  Management options:  Low  Progress:   Patient progress:  Stable      Procedures

## 2019-05-21 ENCOUNTER — Encounter

## 2019-05-22 ENCOUNTER — Ambulatory Visit: Attending: Adult Reconstructive Orthopaedic Surgery

## 2019-05-22 ENCOUNTER — Inpatient Hospital Stay: Admit: 2019-05-22 | Payer: MEDICARE

## 2019-05-22 ENCOUNTER — Ambulatory Visit: Admit: 2019-05-22 | Discharge: 2019-05-22 | Payer: MEDICARE | Attending: Adult Reconstructive Orthopaedic Surgery

## 2019-05-22 DIAGNOSIS — M25551 Pain in right hip: Secondary | ICD-10-CM

## 2019-05-22 NOTE — Progress Notes (Signed)
Identified pt with two pt identifiers (name and DOB). Reviewed chart in preparation for visit and have obtained necessary documentation.  Caroline Shaw is a 72 y.o. female  Chief Complaint   Patient presents with   ??? Hip Pain     RT hip     Visit Vitals  BP 159/81 (BP 1 Location: Left arm, BP Patient Position: Sitting)   Pulse 66   Temp 97.7 ??F (36.5 ??C) (Tympanic)   Ht 5' 5" (1.651 m)   Wt 144 lb (65.3 kg)   SpO2 100%   BMI 23.96 kg/m??     1. Have you been to the ER, urgent care clinic since your last visit?  Hospitalized since your last visit?No    2. Have you seen or consulted any other health care providers outside of the [facility] since your last visit?  Include any pap smears or colon screening. No

## 2019-05-22 NOTE — Progress Notes (Signed)
05/22/2019    Chief Complaint: Right hip pain    HPI: This is a 73 y.o. year old female who complains of right hip pain. The patient has had activity dependent pain for several years, she has a history of what sounds like laminectomy or laser back surgery, she states her pain is in her buttock, there is a small area of swelling which comes and goes but is small today..    The patient has tried tumeric, no physical therapy, injections have not been attempted.  The pain is in the midline back as well as right sided buttock area, it is mild to moderate in intensity.  The pain causes some limitation in the normal activities of daily living.   The patient denies any numbness, weakness, tingling, fevers, chills, nausea, vomiting, fevers, chills, nausea, vomiting.    History reviewed. No pertinent past medical history.    History reviewed. No pertinent surgical history.    No current outpatient medications on file prior to visit.     No current facility-administered medications on file prior to visit.        Allergies   Allergen Reactions   ??? Sulfa (Sulfonamide Antibiotics) Other (comments)     bodyaches        No family history on file.    Social History     Socioeconomic History   ??? Marital status: MARRIED     Spouse name: Not on file   ??? Number of children: Not on file   ??? Years of education: Not on file   ??? Highest education level: Not on file   Tobacco Use   ??? Smoking status: Never Smoker   ??? Smokeless tobacco: Never Used   Substance and Sexual Activity   ??? Alcohol use: Not Currently   ??? Drug use: Never   ??? Sexual activity: Not Currently         Review of Systems:       General: Denies headache, lethargy, fever, weight loss  Ears/Nose/Throat: Denies ear discharge, drainage, nosebleeds, hoarse voice, dental problems  Cardiovascular: Denies chest pain, shortness of breath  Lungs: Denies chest pain, breathing problems, wheezing, pneumonia  Stomach: Denies stomach pain, heartburn, constipation, irritable bowel   Skin: Denies rash, sores, open wounds  Musculoskeletal: Admits right hip pain, no deformity, no instability  Genitourinary: Denies dysuria, hematuria, polyuria  Gastrointestinal: Denies constipation, obstipation, diarrhea  Neurological: Denies changes in sight, smell, hearing, taste, seizures. Denies loss of consciousness.  Psychiatric: Denies depression, sleep pattern changes, anxiety, change in personality  Endocrine: Denies mood swings, heat or cold intolerance  Hematologic/Lymphatic: Denies anemia, purpura, petechia  Allergic/Immunologic: Denies swelling of throat, pain or swelling at lymph nodes      Physical Examination:    Visit Vitals  BP 159/81 (BP 1 Location: Left arm, BP Patient Position: Sitting)   Pulse 66   Temp 97.7 ??F (36.5 ??C) (Tympanic)   Ht 5\' 5"  (1.651 m)   Wt 144 lb (65.3 kg)   SpO2 100%   BMI 23.96 kg/m??        General: AOX3, no apparent distress  Psychiatric: mood and affect appropriate  Lungs: breathing is symmetric and unlabored bilaterally  Heart: regular rate and rhythm  Abdomen: no guarding  Head: normocephalic, atraumatic  Skin: No significant abnormalities, good turgor  Sensation intact to light touch: L1-S1 dermatomes  Muscular exam: 5/5 strength in all major muscle groups unless noted in specialty exam.    Extremities    Left upper  extremity: Full active and passive range of motion without pain, deformity, no open wound, strength 5/5 in all major muscle groups.    Right upper extremity: Full active and passive range of motion without pain, deformity, no open wound, strength 5/5 in all major muscle groups.    Left lower extremity: Full active and passive range of motion without pain, deformity, no open wound, strength 5/5 in all major muscle groups.    Right lower extremity:  No deformity is noted.  Circumduction of the hip causes no pain in the groin, reproductive of the chief complaint.  Range of motion is full, with a negative impingement sign.  FABER test is  negative.  Gait is reciprocal.  No tenderness to palpation on the lateral aspect of the hip.  Sensation is intact to light touch in the L1-S1 dermatomes.  Skin is intact about the hip.  Hip flexion and abduction strength is 5/5, hip extension and knee extension as well as tibialis anterior and EHL/GCS are 5/5 strength.    Diagnostics:    Pertinent Xrays:   Xrays of the right hip indicate no fractures, osseus lesions, abnormalities, cartilage space is well maintained.  Overall alignment is within normal limits, no effusion or other soft tissue abnormality.  Moderate OA of lumbar spine.          Assessment: Sciatic versus sacroiliac pain    Plan:  This patient does have right hip pain.  Differential diagnosis would include: Sacroiliac pain, referred pain from lumbar spine, muscular dysfunction of the gluteus muscles.    We did discuss the general management of hip pain, which is largely nonsurgical.   We discussed that activity modification, weight loss, weight bearing exercises, physical therapy, over the counter medications, prescription medications, ambulatory aids, intra-articular injections, and pain management modalities are the treatment of choice.  Only once these fail, would we consider further measures.  The patient stated their understanding of the pathology, as well as their choices.  We decided to proceed with physical therapy.    Procedures:  None      Follow up will be 1 month            Ms. Briski has a reminder for a "due or due soon" health maintenance. I have asked that she contact her primary care provider for follow-up on this health maintenance.

## 2019-05-22 NOTE — Progress Notes (Signed)
 Identified pt with two pt identifiers (name and DOB). Reviewed chart in preparation for visit and have obtained necessary documentation.  Caroline Shaw is a 73 y.o. female  Chief Complaint   Patient presents with   . Hip Pain     RT hip     Visit Vitals  BP 159/81 (BP 1 Location: Left arm, BP Patient Position: Sitting)   Pulse 66   Temp 97.7 F (36.5 C) (Tympanic)   Ht 5' 5 (1.651 m)   Wt 144 lb (65.3 kg)   SpO2 100%   BMI 23.96 kg/m     1. Have you been to the ER, urgent care clinic since your last visit?  Hospitalized since your last visit?No    2. Have you seen or consulted any other health care providers outside of the [facility] since your last visit?  Include any pap smears or colon screening. No

## 2019-05-22 NOTE — Progress Notes (Signed)
05/22/2019    Chief Complaint: Right hip pain    HPI: This is a 73 y.o. year old female who complains of right hip pain. The patient has had activity dependent pain for several years, she has a history of what sounds like laminectomy or laser back surgery, she states her pain is in her buttock, there is a small area of swelling which comes and goes but is small today..    The patient has tried tumeric, no physical therapy, injections have not been attempted.  The pain is in the midline back as well as right sided buttock area, it is mild to moderate in intensity.  The pain causes some limitation in the normal activities of daily living.   The patient denies any numbness, weakness, tingling, fevers, chills, nausea, vomiting, fevers, chills, nausea, vomiting.    History reviewed. No pertinent past medical history.    History reviewed. No pertinent surgical history.    No current outpatient medications on file prior to visit.     No current facility-administered medications on file prior to visit.        Allergies   Allergen Reactions   ??? Sulfa (Sulfonamide Antibiotics) Other (comments)     bodyaches        No family history on file.    Social History     Socioeconomic History   ??? Marital status: MARRIED     Spouse name: Not on file   ??? Number of children: Not on file   ??? Years of education: Not on file   ??? Highest education level: Not on file   Tobacco Use   ??? Smoking status: Never Smoker   ??? Smokeless tobacco: Never Used   Substance and Sexual Activity   ??? Alcohol use: Not Currently   ??? Drug use: Never   ??? Sexual activity: Not Currently         Review of Systems:       General: Denies headache, lethargy, fever, weight loss  Ears/Nose/Throat: Denies ear discharge, drainage, nosebleeds, hoarse voice, dental problems  Cardiovascular: Denies chest pain, shortness of breath  Lungs: Denies chest pain, breathing problems, wheezing, pneumonia  Stomach: Denies stomach pain, heartburn, constipation, irritable bowel  Skin:  Denies rash, sores, open wounds  Musculoskeletal: Admits right hip pain, no deformity, no instability  Genitourinary: Denies dysuria, hematuria, polyuria  Gastrointestinal: Denies constipation, obstipation, diarrhea  Neurological: Denies changes in sight, smell, hearing, taste, seizures. Denies loss of consciousness.  Psychiatric: Denies depression, sleep pattern changes, anxiety, change in personality  Endocrine: Denies mood swings, heat or cold intolerance  Hematologic/Lymphatic: Denies anemia, purpura, petechia  Allergic/Immunologic: Denies swelling of throat, pain or swelling at lymph nodes      Physical Examination:    Visit Vitals  BP 159/81 (BP 1 Location: Left arm, BP Patient Position: Sitting)   Pulse 66   Temp 97.7 ??F (36.5 ??C) (Tympanic)   Ht 5\' 5"  (1.651 m)   Wt 144 lb (65.3 kg)   SpO2 100%   BMI 23.96 kg/m??        General: AOX3, no apparent distress  Psychiatric: mood and affect appropriate  Lungs: breathing is symmetric and unlabored bilaterally  Heart: regular rate and rhythm  Abdomen: no guarding  Head: normocephalic, atraumatic  Skin: No significant abnormalities, good turgor  Sensation intact to light touch: L1-S1 dermatomes  Muscular exam: 5/5 strength in all major muscle groups unless noted in specialty exam.    Extremities    Left upper  extremity: Full active and passive range of motion without pain, deformity, no open wound, strength 5/5 in all major muscle groups.    Right upper extremity: Full active and passive range of motion without pain, deformity, no open wound, strength 5/5 in all major muscle groups.    Left lower extremity: Full active and passive range of motion without pain, deformity, no open wound, strength 5/5 in all major muscle groups.    Right lower extremity:  No deformity is noted.  Circumduction of the hip causes no pain in the groin, reproductive of the chief complaint.  Range of motion is full, with a negative impingement sign.  FABER test is negative.  Gait is  reciprocal.  No tenderness to palpation on the lateral aspect of the hip.  Sensation is intact to light touch in the L1-S1 dermatomes.  Skin is intact about the hip.  Hip flexion and abduction strength is 5/5, hip extension and knee extension as well as tibialis anterior and EHL/GCS are 5/5 strength.    Diagnostics:    Pertinent Xrays:   Xrays of the right hip indicate no fractures, osseus lesions, abnormalities, cartilage space is well maintained.  Overall alignment is within normal limits, no effusion or other soft tissue abnormality.  Moderate OA of lumbar spine.          Assessment: Sciatic versus sacroiliac pain    Plan:  This patient does have right hip pain.  Differential diagnosis would include: Sacroiliac pain, referred pain from lumbar spine, muscular dysfunction of the gluteus muscles.    We did discuss the general management of hip pain, which is largely nonsurgical.   We discussed that activity modification, weight loss, weight bearing exercises, physical therapy, over the counter medications, prescription medications, ambulatory aids, intra-articular injections, and pain management modalities are the treatment of choice.  Only once these fail, would we consider further measures.  The patient stated their understanding of the pathology, as well as their choices.  We decided to proceed with physical therapy.    Procedures:  None      Follow up will be 1 month            [name] has a reminder for a "due or due soon" health maintenance. I have asked that she contact her primary care provider for follow-up on this health maintenance.

## 2019-06-03 ENCOUNTER — Inpatient Hospital Stay: Admit: 2019-06-03 | Payer: MEDICARE | Attending: Rehabilitative and Restorative Service Providers"

## 2019-06-03 DIAGNOSIS — M25551 Pain in right hip: Secondary | ICD-10-CM

## 2019-06-03 NOTE — Progress Notes (Signed)
[place] ([facility]), [place]  [place], [place]  Phone: [phone] Fax: [phone]     Plan of Care/Statement of Necessity for Physical Therapy Services  2-15    Patient name: Caroline Shaw  DOB: 12/06/1945  Provider#: [id number]  Referral source: Aaron Mose, DO      Medical/Treatment Diagnosis: Pain in right hip [M25.551]     Prior Hospitalization: see medical history     Comorbidities: arthritis, back pain, prior surgery  Prior Level of Function: 20 min at least 3 times a week  Medications: Verified on Patient Summary List  Start of Care: 06/03/19      Onset Date: chronic   The Plan of Care and following information is based on the information from the initial evaluation.    Assessment/ key information: Pt is a 74 yo female who is in today to begin therapy for back and hip pain that started years ago.  She states that she recently decided to see the MD for ongoing issues since a back surgery 7 years prior.  She presents with decreased left sidebending and rotation, as well as pain with extension,  She has decreased hip flexor and hamstring strength.  There was a right on left sacral torsion today on assessment that was corrected with MET.  She reports that she remains very active in [employer] and enjoys kayaking and dancing.  Pt is a good candidate for PT and will benefit from skilled services to address these deficits and work toward the goals listed below.        Evaluation Complexity History MEDIUM  Complexity : 1-2 comorbidities / personal factors will impact the outcome/ POC ; Examination MEDIUM Complexity : 3 Standardized tests and measures addressing body structure, function, activity limitation and / or participation in recreation  ;Presentation MEDIUM Complexity : Evolving with changing characteristics  ;Clinical Decision Making MEDIUM Complexity : FOTO score of 26-74  Overall Complexity Rating: MEDIUM     Problem List: pain affecting function, decrease ROM, decrease strength, impaired gait/ balance, decrease ADL/ functional abilitiies, decrease activity tolerance and decrease flexibility/ joint mobility   Treatment Plan may include any combination of the following: Therapeutic exercise, Therapeutic activities, Neuromuscular re-education, Physical agent/modality, Gait/balance training, Manual therapy, Patient education, Self Care training, Functional mobility training, Home safety training, Stair training and Other: dry needling  Patient / Family readiness to learn indicated by: asking questions, trying to perform skills and interest  Persons(s) to be included in education: patient (P)  Barriers to Learning/Limitations: None  Patient Goal (s): ???decrease pain???  Patient Self Reported Health Status: excellent  Rehabilitation Potential: good    Short Term Goals: To be accomplished in 8-10 treatments:   Pt will be I with HEP  Pt will complain of pain 0-1/10 with all activity  Pt will increase ROM to complete all ADL's more efficiently  Pt will be able to maintain positions for 60 min without increased symptoms    Long Term Goals: To be accomplished in 14-16 treatments:   Pt will complain of pain 0/10 with all activity  Pt will increase strength to maintain better lumbopelvic alignment  Pt will increase FOTO score by 6 points to meet MDC and improve their function  Pt will be able to complete all work tasks without increased symptoms    Frequency / Duration: Patient to be seen 2 times per week for 14-16 treatments.    Patient/ Caregiver education and instruction: self  care, activity modification and exercises    [x]   Plan of care has been reviewed with PTA        Certification Period: 06/03/19-09/01/19  Wilson Singer, PT 06/09/2019     ________________________________________________________________________    I certify that the above Therapy Services are being furnished while the  patient is under my care. I agree with the treatment plan and certify that this therapy is necessary.    42 Signature:____________________  Date:____________Time: _________

## 2019-06-03 NOTE — Progress Notes (Signed)
PT INITIAL EVALUATION NOTE - MCR 2-15    Patient Name: MARSHELLE BILGER  Date:06/03/2019  DOB: 1946/03/15  [x]   Patient DOB Verified  [place] / Plan: [place] MED[place] & B / Product Type: Medicare /    In time:1023  Out time:1117  Total Treatment Time (min): 54  Total Timed Codes (min): 24  1:1 Treatment Time (MC only): 24   Visit #: 1     Treatment Area: Pain in right hip [M25.551]    SUBJECTIVE  Pain Level (0-10 scale): 1  Any medication changes, allergies to medications, adverse drug reactions, diagnosis change, or new procedure performed?: []  No    [x]  Yes (see summary sheet for update)  Subjective:    Pt reports a history of back and sciatic pain.  She states that she had a pinched nerve.  She moved forward with laser spine surgery 7 years ago to relieve sciatic pain.  Within weeks she had increased hip pain.  She was placed on muscle relaxors that never really helped.  Since then she has had on and off hip and back pain.  She grew tired of the discomfort and went to see Dr. Dillon Bjork 2 weeks ago.  He explained that her pain was muscle related and sent her to PT.  X-rays of the hip and low back were negative with the exception of arthritis in the low back.  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  She does complain of stationary bending and standing cause the most pain.  She struggles to stand in one place longer than 45 minutes.  She denies numbness or tingling.  She indicates the right low back and buttocks as the area of discomfort and reports that there is a lump present there.  She does put topical essential oil mixture on the back and uses tumeric.  Occasionally she will use aspirin as well.  2-3 stairs to enter her home that she reports are not a problem.  She lives at home with her husband.  She enjoys kayaking, ball room and square dancing.  She states that if she starts without pain she doesn't get pain  but if she starts dancing with pain it gets worse.           OBJECTIVE    Posture:  Seated rounded shoulders and forward head  Gait and Functional Mobility:  Right hip drop, but compensated trendelenberg on right as well  Palpation: tendern to palpation over right PSIS        Lumbar AROM:          R  L    Flexion     100     Extension    100 P!     Side Bending   100  50    Rotation   100  75        LOWER QUARTER   MUSCLE STRENGTH  KEY       R  L  0 - No Contraction  L1, L2 Psoas  3  4  1  - Trace   L3 Quads  5  5  2  - Poor   L4 Tib Ant  5  5  3  - Fair    L5 EHL  5  5  4  - Good   S1 Peroneals  NT  NT  5 - Normal   S2 Hams  4  3    Flexibility: unremarkable  Mobility Assessment: good       MMT:               HIP ER: 4              HIP Abd: 4  Neurological: Reflexes / Sensations: grossly intact  Special Tests:    Trendelenberg: +    FABERS: -   Forward Bend: -    Slump: -   H.S. SLR: -     Piriformis Ext: +   Long Sit: +     Lumbar Distraction: NT   SI Compression/Distraction: NT    Modality rationale: decrease inflammation and decrease pain to improve the patient???s ability to complete all activity   Min Type Additional Details                       10 [x]   Ice     []   Heat  []   Ice massage Position:supine  Location:low back       15 min Therapeutic Exercise:  [x]  See flow sheet :   Rationale: increase ROM, increase strength, improve coordination and improve balance to improve the patient???s ability to complete all activity    9 min Manual Therapy: corrected right on lefft sacral torsion, shotgun to address pubic symphasis, STM to right QL    Rationale: decrease pain, increase ROM, increase tissue extensibility, decrease trigger points and increase postural awareness to improve the patient???s ability to complete all activity    Other Objective/Functional Measures: FOTO 58    Pain Level (0-10 scale) post treatment: 1-2    ASSESSMENT/Changes in Function:     [x]   See Plan of Care      Glenis Smokeravid M Oma Alpert, PT 06/03/2019

## 2019-06-03 NOTE — Progress Notes (Signed)
 [facility]  [address] ([address]), [address]  [place], [place]  [address]  Phone: [phone] Fax: [phone]     Plan of Care/Statement of Necessity for Physical Therapy Services  2-15    Patient name: Caroline Shaw  DOB: July 12, 1946  Provider#: [id number]  Referral source: Budny, Jacob M, DO      Medical/Treatment Diagnosis: Pain in right hip [M25.551]     Prior Hospitalization: see medical history     Comorbidities: arthritis, back pain, prior surgery  Prior Level of Function: 20 min at least 3 times a week  Medications: Verified on Patient Summary List  Start of Care: 06/03/19      Onset Date: chronic   The Plan of Care and following information is based on the information from the initial evaluation.    Assessment/ key information: Pt is a 73 yo female who is in today to begin therapy for back and hip pain that started years ago.  She states that she recently decided to see the MD for ongoing issues since a back surgery 7 years prior.  She presents with decreased left sidebending and rotation, as well as pain with extension,  She has decreased hip flexor and hamstring strength.  There was a right on left sacral torsion today on assessment that was corrected with MET.  She reports that she remains very active in [employer] and enjoys kayaking and dancing.  Pt is a good candidate for PT and will benefit from skilled services to address these deficits and work toward the goals listed below.        Evaluation Complexity History MEDIUM  Complexity : 1-2 comorbidities / personal factors will impact the outcome/ POC ; Examination MEDIUM Complexity : 3 Standardized tests and measures addressing body structure, function, activity limitation and / or participation in recreation  ;Presentation MEDIUM Complexity : Evolving with changing characteristics  ;Clinical Decision Making MEDIUM Complexity : FOTO score of 26-74  Overall Complexity Rating: MEDIUM    Problem List: pain  affecting function, decrease ROM, decrease strength, impaired gait/ balance, decrease ADL/ functional abilitiies, decrease activity tolerance and decrease flexibility/ joint mobility   Treatment Plan may include any combination of the following: Therapeutic exercise, Therapeutic activities, Neuromuscular re-education, Physical agent/modality, Gait/balance training, Manual therapy, Patient education, Self Care training, Functional mobility training, Home safety training, Stair training and Other: dry needling  Patient / Family readiness to learn indicated by: asking questions, trying to perform skills and interest  Persons(s) to be included in education: patient (P)  Barriers to Learning/Limitations: None  Patient Goal (s): "decrease pain"  Patient Self Reported Health Status: excellent  Rehabilitation Potential: good    Short Term Goals: To be accomplished in 8-10 treatments:   Pt will be I with HEP  Pt will complain of pain 0-1/10 with all activity  Pt will increase ROM to complete all ADL's more efficiently  Pt will be able to maintain positions for 60 min without increased symptoms    Long Term Goals: To be accomplished in 14-16 treatments:   Pt will complain of pain 0/10 with all activity  Pt will increase strength to maintain better lumbopelvic alignment  Pt will increase FOTO score by 6 points to meet MDC and improve their function  Pt will be able to complete all work tasks without increased symptoms    Frequency / Duration: Patient to be seen 2 times per week for 14-16 treatments.    Patient/ Caregiver education and instruction: self  care, activity modification and exercises    [x]   Plan of care has been reviewed with PTA        Certification Period: 06/03/19-09/01/19  Alm CHRISTELLA Ada, PT 06/09/2019     ________________________________________________________________________    I certify that the above Therapy Services are being furnished while the patient is under my care. I agree with the treatment plan and  certify that this therapy is necessary.    Physician's Signature:____________________  Date:____________Time: _________

## 2019-06-03 NOTE — Progress Notes (Signed)
 PT INITIAL EVALUATION NOTE - MCR 2-15    Patient Name: Caroline Shaw  Date:06/03/2019  DOB: [birth year]  [x]   Patient DOB Verified  Payor: [place] MEDICARE / Plan: [place] MEDICARE PART A & B / Product Type: Medicare /    In time:1023  Out time:1117  Total Treatment Time (min): 54  Total Timed Codes (min): 24  1:1 Treatment Time (MC only): 24   Visit #: 1     Treatment Area: Pain in right hip [M25.551]    SUBJECTIVE  Pain Level (0-10 scale): 1  Any medication changes, allergies to medications, adverse drug reactions, diagnosis change, or new procedure performed?: []  No    [x]  Yes (see summary sheet for update)  Subjective:    Pt reports a history of back and sciatic pain.  She states that she had a pinched nerve.  She moved forward with laser spine surgery 7 years ago to relieve sciatic pain.  Within weeks she had increased hip pain.  She was placed on muscle relaxors that never really helped.  Since then she has had on and off hip and back pain.  She grew tired of the discomfort and went to see Dr. Lansing 2 weeks ago.  He explained that her pain was muscle related and sent her to PT.  X-rays of the hip and low back were negative with the exception of arthritis in the low back.  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  She does complain of stationary bending and standing cause the most pain.  She struggles to stand in one place longer than 45 minutes.  She denies numbness or tingling.  She indicates the right low back and buttocks as the area of discomfort and reports that there is a lump present there.  She does put topical essential oil mixture on the back and uses tumeric.  Occasionally she will use aspirin as well.  2-3 stairs to enter her home that she reports are not a problem.  She lives at home with her husband.  She enjoys kayaking, ball room and square dancing.  She states that if she starts without pain she doesn't get pain but if  she starts dancing with pain it gets worse.           OBJECTIVE    Posture:  Seated rounded shoulders and forward head  Gait and Functional Mobility:  Right hip drop, but compensated trendelenberg on right as well  Palpation: tendern to palpation over right PSIS        Lumbar AROM:          R  L    Flexion     100     Extension    100 P!     Side Bending   100  50    Rotation   100  75        LOWER QUARTER   MUSCLE STRENGTH  KEY       R  L  0 - No Contraction  L1, L2 Psoas  3  4  1  - Trace   L3 Quads  5  5  2  - Poor   L4 Tib Ant  5  5  3  - Fair    L5 EHL  5  5  4  - Good   S1 Peroneals  NT  NT  5 - Normal   S2 Hams  4  3    Flexibility: unremarkable  Mobility Assessment: good       MMT:               HIP ER: 4              HIP Abd: 4  Neurological: Reflexes / Sensations: grossly intact  Special Tests:    Trendelenberg: +    FABERS: -   Forward Bend: -    Slump: -   H.S. SLR: -     Piriformis Ext: +   Long Sit: +     Lumbar Distraction: NT   SI Compression/Distraction: NT    Modality rationale: decrease inflammation and decrease pain to improve the patient's ability to complete all activity   Min Type Additional Details                       10 [x]   Ice     []   Heat  []   Ice massage Position:supine  Location:low back       15 min Therapeutic Exercise:  [x]  See flow sheet :   Rationale: increase ROM, increase strength, improve coordination and improve balance to improve the patient's ability to complete all activity    9 min Manual Therapy: corrected right on lefft sacral torsion, shotgun to address pubic symphasis, STM to right QL    Rationale: decrease pain, increase ROM, increase tissue extensibility, decrease trigger points and increase postural awareness to improve the patient's ability to complete all activity    Other Objective/Functional Measures: FOTO 58    Pain Level (0-10 scale) post treatment: 1-2    ASSESSMENT/Changes in Function:     [x]   See Plan of Care      Caroline Shaw, PT 06/03/2019

## 2019-06-09 ENCOUNTER — Inpatient Hospital Stay: Admit: 2019-06-09 | Payer: MEDICARE

## 2019-06-09 NOTE — Progress Notes (Signed)
PT DAILY TREATMENT NOTE - MCR 2-15    Patient Name: Caroline Shaw  Date:06/09/2019  DOB: [birth year]  [x]   Patient DOB Verified  [place]or: [place] MED[place]ARE / Plan: [place] MEDICARE PART A & B / Product Type: Medicare /    In time:945  Out time:1030  Total Treatment Time (min): 45  Total Timed Codes (min): 45  1:1 Treatment Time ([place] only): 45   Visit #:  2    Treatment Area: Right hip pain [M25.551]    SUBJECTIVE  Pain Level (0-10 scale): 2  Any medication changes, allergies to medications, adverse drug reactions, diagnosis change, or new procedure performed?: [x]  No    []  Yes (see summary sheet for update)  Subjective functional status/changes:   []  No changes reported  Compliant with HEP.   Felt initially that the exercises were easy so increased her repetitions up to 20.  This caused increased right low back pain so she has backed off again.   Overall s/s consistent today with initial visit.    OBJECTIVE    45 min Therapeutic Exercise:  []  See flow sheet :   Rationale: increase ROM and increase strength to improve the patient???s ability to improve function            With   [x]  TE   []  TA   []  neuro   []  other: Patient Education: [x]  Review HEP    []  Progressed/Changed HEP based on:   [x]  positioning   [x]  body mechanics   []  transfers   []  heat/ice application    []  other:        Other Objective/Functional Measures:      Pain Level (0-10 scale) post treatment: 2    ASSESSMENT/Changes in Function:   Reviewed patient's HEP at length.   Good form with all exercises accept with her abdominal bracing.   I taught her how to palpate her TA while performing the exercises that require bracing.   Extensive review of lifting mechanics, log roll into and out of bed, ADL performance.  Patient has difficulty bending down to pick objects off the floor due to a lack of mobility. Advanced HEP with the focus on mobility/flexibility.  Provided pictures for all exercises.       Patient will continue to benefit from skilled PT services to modify and progress therapeutic interventions, address functional mobility deficits, address ROM deficits and address strength deficits to attain remaining goals.     [x]   See Plan of Care  []   See progress note/recertification  []   See Discharge Summary         Progress towards goals / Updated goals:      PLAN  []   Upgrade activities as tolerated     [x]   Continue plan of care  []   Update interventions per flow sheet       []   Discharge due to:_  []   Other:_      Lanny Hurst, PT 06/09/2019

## 2019-06-09 NOTE — Progress Notes (Signed)
 PT DAILY TREATMENT NOTE - MCR 2-15    Patient Name: Caroline Shaw  Date:06/09/2019  DOB: 07/20/1946  [x]   Patient DOB Verified  Payor: [place] MEDICARE / Plan: [place] MEDICARE PART A & B / Product Type: Medicare /    In time:945  Out time:1030  Total Treatment Time (min): 45  Total Timed Codes (min): 45  1:1 Treatment Time ([facility] only): 45   Visit #:  2    Treatment Area: Right hip pain [M25.551]    SUBJECTIVE  Pain Level (0-10 scale): 2  Any medication changes, allergies to medications, adverse drug reactions, diagnosis change, or new procedure performed?: [x]  No    []  Yes (see summary sheet for update)  Subjective functional status/changes:   []  No changes reported  Compliant with HEP.   Felt initially that the exercises were easy so increased her repetitions up to 20.  This caused increased right low back pain so she has backed off again.   Overall s/s consistent today with initial visit.    OBJECTIVE    45 min Therapeutic Exercise:  []  See flow sheet :   Rationale: increase ROM and increase strength to improve the patient's ability to improve function            With   [x]  TE   []  TA   []  neuro   []  other: Patient Education: [x]  Review HEP    []  Progressed/Changed HEP based on:   [x]  positioning   [x]  body mechanics   []  transfers   []  heat/ice application    []  other:        Other Objective/Functional Measures:      Pain Level (0-10 scale) post treatment: 2    ASSESSMENT/Changes in Function:   Reviewed patient's HEP at length.   Good form with all exercises accept with her abdominal bracing.   I taught her how to palpate her TA while performing the exercises that require bracing.   Extensive review of lifting mechanics, log roll into and out of bed, ADL performance.  Patient has difficulty bending down to pick objects off the floor due to a lack of mobility. Advanced HEP with the focus on mobility/flexibility.  Provided pictures for all exercises.      Patient will continue to benefit from skilled PT services to  modify and progress therapeutic interventions, address functional mobility deficits, address ROM deficits and address strength deficits to attain remaining goals.     [x]   See Plan of Care  []   See progress note/recertification  []   See Discharge Summary         Progress towards goals / Updated goals:      PLAN  []   Upgrade activities as tolerated     [x]   Continue plan of care  []   Update interventions per flow sheet       []   Discharge due to:_  []   Other:_      [place] JONETTA Fairly, PT 06/09/2019

## 2019-06-11 ENCOUNTER — Inpatient Hospital Stay: Admit: 2019-06-11 | Payer: MEDICARE | Attending: Rehabilitative and Restorative Service Providers"

## 2019-06-11 DIAGNOSIS — M25551 Pain in right hip: Secondary | ICD-10-CM

## 2019-06-11 NOTE — Progress Notes (Signed)
PT DAILY TREATMENT NOTE - MCR 2-15    Patient Name: Caroline Shaw  Date:06/11/2019  DOB: 10/02/1946  [x]   Patient DOB Verified  Payor: [place] MEDICARE / Plan: [place] MEDICARE PART A & B / Product Type: Medicare /    In time:133 Out time:225  Total Treatment Time (min): 48  Total Timed Codes (min): 38  1:1 Treatment Time ([place] only): 9   Visit #:  3    Treatment Area: Right hip pain [M25.551]    SUBJECTIVE  Pain Level (0-10 scale): 1  Any medication changes, allergies to medications, adverse drug reactions, diagnosis change, or new procedure performed?: [x]  No    []  Yes (see summary sheet for update)  Subjective functional status/changes:   [x]  No changes reported      OBJECTIVE    Modality rationale: decrease inflammation and decrease pain to improve the patient???s ability to complete all activity   Min Type Additional Details                       10 []   Ice     [x]   Heat  []   Ice massage Position:supine  Location:low back     [x]  Skin assessment post-treatment:  [x] intact [x] redness- no adverse reaction    [] redness ??? adverse reaction:     38 min Therapeutic Exercise:  [x]  See flow sheet :   Rationale: increase ROM, increase strength and improve coordination to improve the patient???s ability to complete all activity    Other Objective/Functional Measures:      Pain Level (0-10 scale) post treatment: 1-2    ASSESSMENT/Changes in Function:   Pt is able to complete all activity she is challenge with birddog and pull down step up as it was difficult for her balance.  Will continue to work on stability and balance to address her core and ability to complete all activity    Patient will continue to benefit from skilled PT services to modify and progress therapeutic interventions, address functional mobility deficits, address ROM deficits, address strength deficits, analyze and address soft tissue restrictions, analyze and cue movement patterns, analyze and modify  body mechanics/ergonomics, assess and modify postural abnormalities and address imbalance/dizziness to attain remaining goals.     []   See Plan of Care  []   See progress note/recertification  []   See Discharge Summary         PLAN  [x]   Upgrade activities as tolerated     [x]   Continue plan of care  [x]   Update interventions per flow sheet       []   Discharge due to:_  []   Other:_      Wilson Singer, PT 06/11/2019

## 2019-06-11 NOTE — Progress Notes (Signed)
 PT DAILY TREATMENT NOTE - MCR 2-15    Patient Name: Caroline Shaw  Date:06/11/2019  DOB: [birth year]  [x]   Patient DOB Verified  Payor: [place] MEDICARE / Plan: [place] MEDICARE PART A & B / Product Type: Medicare /    In time:133 Out time:225  Total Treatment Time (min): 48  Total Timed Codes (min): 38  1:1 Treatment Time ([facility] only): 38   Visit #:  3    Treatment Area: Right hip pain [M25.551]    SUBJECTIVE  Pain Level (0-10 scale): 1  Any medication changes, allergies to medications, adverse drug reactions, diagnosis change, or new procedure performed?: [x]  No    []  Yes (see summary sheet for update)  Subjective functional status/changes:   [x]  No changes reported      OBJECTIVE    Modality rationale: decrease inflammation and decrease pain to improve the patient's ability to complete all activity   Min Type Additional Details                       10 []   Ice     [x]   Heat  []   Ice massage Position:supine  Location:low back     [x]  Skin assessment post-treatment:  [x] intact [x] redness- no adverse reaction    [] redness - adverse reaction:     38 min Therapeutic Exercise:  [x]  See flow sheet :   Rationale: increase ROM, increase strength and improve coordination to improve the patient's ability to complete all activity    Other Objective/Functional Measures:      Pain Level (0-10 scale) post treatment: 1-2    ASSESSMENT/Changes in Function:   Pt is able to complete all activity she is challenge with birddog and pull down step up as it was difficult for her balance.  Will continue to work on stability and balance to address her core and ability to complete all activity    Patient will continue to benefit from skilled PT services to modify and progress therapeutic interventions, address functional mobility deficits, address ROM deficits, address strength deficits, analyze and address soft tissue restrictions, analyze and cue movement patterns, analyze and modify body mechanics/ergonomics, assess and modify postural  abnormalities and address imbalance/dizziness to attain remaining goals.     []   See Plan of Care  []   See progress note/recertification  []   See Discharge Summary         PLAN  [x]   Upgrade activities as tolerated     [x]   Continue plan of care  [x]   Update interventions per flow sheet       []   Discharge due to:_  []   Other:_      Alm CHRISTELLA Ada, PT 06/11/2019

## 2019-06-17 ENCOUNTER — Inpatient Hospital Stay: Admit: 2019-06-17 | Payer: MEDICARE | Attending: Rehabilitative and Restorative Service Providers"

## 2019-06-17 NOTE — Progress Notes (Signed)
PT DAILY TREATMENT NOTE - MCR 2-15    Patient Name: Caroline Shaw  Date:06/17/2019  DOB: 06-Nov-1945  [x]   Patient DOB Verified  Payor: [place] MEDICARE / Plan: [place] MEDICARE PART A & B / Product Type: Medicare /    In time:1020  Out time:1118  Total Treatment Time (min): 58  Total Timed Codes (min): 48  1:1 Treatment Time ([facility] only): 56   Visit #:  4    Treatment Area: Right hip pain [M25.551]    SUBJECTIVE  Pain Level (0-10 scale): 7  Any medication changes, allergies to medications, adverse drug reactions, diagnosis change, or new procedure performed?: [x]  No    []  Yes (see summary sheet for update)  Subjective functional status/changes:   []  No changes reported  Pt reports that she is in greater pain today in the back and nothing has helped improve this.  She states she painted a closet yesterday and was in the floor and up a ladder      OBJECTIVE    Modality rationale: decrease inflammation and decrease pain to improve the patient???s ability to complete all activity   Min Type Additional Details                       10 [x]   Ice     []   Heat  []   Ice massage Position:supine  Location:low back and left hip       30 min Therapeutic Exercise:  [x]  See flow sheet :   Rationale: increase ROM, increase strength and improve coordination to improve the patient???s ability to complete all activity    18 min Manual Therapy: lumbar distraction, lateral left hip and inferior distraction    Rationale: decrease pain and increase ROM to improve the patient???s ability to complete all activity    Pain Level (0-10 scale) post treatment: 4-4    ASSESSMENT/Changes in Function:   Pt is progressing slowly and still having moments of pain.  Relief of symptoms with right hip mobs which may lend itself to determining that this is in fact being caused by the hip.  However some of the referred pain is trveling down the leg to the knee in and L5 pattern.  Will continue to assessPatient will continue to benefit from skilled PT  services to modify and progress therapeutic interventions, address functional mobility deficits, address ROM deficits and address strength deficits to attain remaining goals.     []   See Plan of Care  []   See progress note/recertification  []   See Discharge Summary         PLAN  [x]   Upgrade activities as tolerated     [x]   Continue plan of care  [x]   Update interventions per flow sheet       []   Discharge due to:_  []   Other:_      Wilson Singer, PT 06/17/2019

## 2019-06-17 NOTE — Progress Notes (Signed)
PT DAILY TREATMENT NOTE - MCR 2-15    Patient Name: Caroline Shaw  Date:06/17/2019  DOB: [birth year]  [x]   Patient DOB Verified  Payor: [place] MEDICARE / Plan: VA MEDICARE PART A & B / Product Type: Medicare /    In time:1020  Out time:1118  Total Treatment Time (min): 58  Total Timed Codes (min): 48  1:1 Treatment Time ([facility] only): 48   Visit #:  4    Treatment Area: Right hip pain [M25.551]    SUBJECTIVE  Pain Level (0-10 scale): 7  Any medication changes, allergies to medications, adverse drug reactions, diagnosis change, or new procedure performed?: [x]  No    []  Yes (see summary sheet for update)  Subjective functional status/changes:   []  No changes reported  Pt reports that she is in greater pain today in the back and nothing has helped improve this.  She states she painted a closet yesterday and was in the floor and up a ladder      OBJECTIVE    Modality rationale: decrease inflammation and decrease pain to improve the patient's ability to complete all activity   Min Type Additional Details                       10 [x]   Ice     []   Heat  []   Ice massage Position:supine  Location:low back and left hip       30 min Therapeutic Exercise:  [x]  See flow sheet :   Rationale: increase ROM, increase strength and improve coordination to improve the patient's ability to complete all activity    18 min Manual Therapy: lumbar distraction, lateral left hip and inferior distraction    Rationale: decrease pain and increase ROM to improve the patient's ability to complete all activity    Pain Level (0-10 scale) post treatment: 4-4    ASSESSMENT/Changes in Function:   Pt is progressing slowly and still having moments of pain.  Relief of symptoms with right hip mobs which may lend itself to determining that this is in fact being caused by the hip.  However some of the referred pain is trveling down the leg to the knee in and L5 pattern.  Will continue to assessPatient will continue to benefit from skilled PT services to  modify and progress therapeutic interventions, address functional mobility deficits, address ROM deficits and address strength deficits to attain remaining goals.     []   See Plan of Care  []   See progress note/recertification  []   See Discharge Summary         PLAN  [x]   Upgrade activities as tolerated     [x]   Continue plan of care  [x]   Update interventions per flow sheet       []   Discharge due to:_  []   Other:_      Glenis Smoker, PT 06/17/2019

## 2019-06-19 ENCOUNTER — Ambulatory Visit: Attending: Adult Reconstructive Orthopaedic Surgery

## 2019-06-19 ENCOUNTER — Ambulatory Visit: Admit: 2019-06-19 | Discharge: 2019-06-19 | Payer: MEDICARE | Attending: Adult Reconstructive Orthopaedic Surgery

## 2019-06-19 DIAGNOSIS — M25551 Pain in right hip: Secondary | ICD-10-CM

## 2019-06-19 MED ORDER — MELOXICAM 15 MG TAB
15 mg | ORAL_TABLET | Freq: Every day | ORAL | 1 refills | Status: AC
Start: 2019-06-19 — End: ?

## 2019-06-19 NOTE — Progress Notes (Signed)
06/19/2019      CC: Low back pain    HPI:      This is a 73 y.o. year old female who presents for a follow up visit.  The patient was last seen and diagnosed with low back pain, SI joint pain.   The patient's treatments since the most recent visit have comprised of PT.   The patient has had good relief of the chief complaint.  She now has more back pain and hip pain.      PMH:  History reviewed. No pertinent past medical history.    PSxHx:  History reviewed. No pertinent surgical history.    Meds:    Current Outpatient Medications:   ???  multivitamin (MULTIPLE VITAMIN ESSENTIAL PO), Take 1 Cap by mouth daily., Disp: , Rfl:   ???  meloxicam (MOBIC) 15 mg tablet, Take 1 Tab by mouth daily., Disp: 60 Tab, Rfl: 1    All:  Allergies   Allergen Reactions   ??? Sulfa (Sulfonamide Antibiotics) Other (comments)     bodyaches        Social Hx:  Social History     Socioeconomic History   ??? Marital status: MARRIED     Spouse name: Not on file   ??? Number of children: Not on file   ??? Years of education: Not on file   ??? Highest education level: Not on file   Tobacco Use   ??? Smoking status: Never Smoker   ??? Smokeless tobacco: Never Used   Substance and Sexual Activity   ??? Alcohol use: Not Currently   ??? Drug use: Never   ??? Sexual activity: Not Currently       Family Hx:  No family history on file.      Review of Systems:       General: Denies headache, lethargy, fever, weight loss  Ears/Nose/Throat: Denies ear discharge, drainage, nosebleeds, hoarse voice, dental problems  Cardiovascular: Denies chest pain, shortness of breath  Lungs: Denies chest pain, breathing problems, wheezing, pneumonia  Stomach: Denies stomach pain, heartburn, constipation, irritable bowel  Skin: Denies rash, sores, open wounds  Musculoskeletal: Low back pain  Genitourinary: Denies dysuria, hematuria, polyuria  Gastrointestinal: Denies constipation, obstipation, diarrhea  Neurological: Denies changes in sight, smell, hearing, taste, seizures.  Denies loss of consciousness.  Psychiatric: Denies depression, sleep pattern changes, anxiety, change in personality  Endocrine: Denies mood swings, heat or cold intolerance  Hematologic/Lymphatic: Denies anemia, purpura, petechia  Allergic/Immunologic: Denies swelling of throat, pain or swelling at lymph nodes      Physical Examination:    Visit Vitals  BP 138/89 (BP 1 Location: Right arm, BP Patient Position: Sitting)   Pulse 75   Temp 98.3 ??F (36.8 ??C) (Tympanic)   Ht 5\' 5"  (1.651 m)   Wt 148 lb (67.1 kg)   SpO2 99%   BMI 24.63 kg/m??        General: AOX3, no apparent distress  Psychiatric: mood and affect appropriate  Lungs: breathing is symmetric and unlabored bilaterally  Heart: regular rate and rhythm  Abdomen: no guarding  Head: normocephalic, atraumatic  Skin: No significant abnormalities, good turgor  Sensation intact to light touch: C5-T1 dermatomes  Muscular exam: 5/5 strength in all major muscle groups unless noted in specialty exam.    Extremities      Right upper extremity: No exam  Left upper extremity: No exam  Right lower extremity: Extremity is examined, no evidence of gross deformity, no gross motor or sensory  deficit.  Full active and passive range of motion is noted.  Muscle tone and bulk is within normal expected limits.  Capillary refill is less than 2 seconds distally.  Left lower extremity: Extremity is examined, no evidence of gross deformity, no gross motor or sensory deficit.  Full active and passive range of motion is noted.  Muscle tone and bulk is within normal expected limits.  Capillary refill is less than 2 seconds distally.      Diagnostics:    Pertinent Diagnostics: None today    Assessment: Osteoarthritis, lumbar spine  Plan:    This patient I had a long discussion regarding her treatment options, she could continue to stay with her physical therapy which seems to be working for her hip, additionally, plan will be to refer her out for pain control  with possible epidural steroid injections going forward.  She stated her understanding and satisfaction, follow-up will be in approximately 6 weeks for repeat clinical check.      [name] has a reminder for a "due or due soon" health maintenance. I have asked that she contact her primary care provider for follow-up on this health maintenance.

## 2019-06-19 NOTE — Progress Notes (Signed)
Identified pt with two pt identifiers (name and DOB). Reviewed chart in preparation for visit and have obtained necessary documentation.  Caroline Shaw is a 72 y.o. female  Chief Complaint   Patient presents with   ??? Hip Pain     4 wk f/u RT hip     Visit Vitals  BP 138/89 (BP 1 Location: Right arm, BP Patient Position: Sitting)   Pulse 75   Temp 98.3 ??F (36.8 ??C) (Tympanic)   Ht 5' 5" (1.651 m)   Wt 148 lb (67.1 kg)   SpO2 99%   BMI 24.63 kg/m??     1. Have you been to the ER, urgent care clinic since your last visit?  Hospitalized since your last visit?No    2. Have you seen or consulted any other health care providers outside of the [facility] since your last visit?  Include any pap smears or colon screening. No

## 2019-06-19 NOTE — Progress Notes (Signed)
Identified pt with two pt identifiers (name and DOB). Reviewed chart in preparation for visit and have obtained necessary documentation.  Caroline Shaw is a 73 y.o. female  Chief Complaint   Patient presents with   . Hip Pain     4 wk f/u RT hip     Visit Vitals  BP 138/89 (BP 1 Location: Right arm, BP Patient Position: Sitting)   Pulse 75   Temp 98.3 F (36.8 C) (Tympanic)   Ht 5\' 5"  (1.651 m)   Wt 148 lb (67.1 kg)   SpO2 99%   BMI 24.63 kg/m     1. Have you been to the ER, urgent care clinic since your last visit?  Hospitalized since your last visit?No    2. Have you seen or consulted any other health care providers outside of the [facility] since your last visit?  Include any pap smears or colon screening. No

## 2019-06-19 NOTE — Progress Notes (Signed)
06/19/2019      CC: Low back pain    HPI:      This is a 73 y.o. year old female who presents for a follow up visit.  The patient was last seen and diagnosed with low back pain, SI joint pain.   The patient's treatments since the most recent visit have comprised of PT.   The patient has had good relief of the chief complaint.  She now has more back pain and hip pain.      PMH:  History reviewed. No pertinent past medical history.    PSxHx:  History reviewed. No pertinent surgical history.    Meds:    Current Outpatient Medications:   ???  multivitamin (MULTIPLE VITAMIN ESSENTIAL PO), Take 1 Cap by mouth daily., Disp: , Rfl:   ???  meloxicam (MOBIC) 15 mg tablet, Take 1 Tab by mouth daily., Disp: 60 Tab, Rfl: 1    All:  Allergies   Allergen Reactions   ??? Sulfa (Sulfonamide Antibiotics) Other (comments)     bodyaches        Social Hx:  Social History     Socioeconomic History   ??? Marital status: MARRIED     Spouse name: Not on file   ??? Number of children: Not on file   ??? Years of education: Not on file   ??? Highest education level: Not on file   Tobacco Use   ??? Smoking status: Never Smoker   ??? Smokeless tobacco: Never Used   Substance and Sexual Activity   ??? Alcohol use: Not Currently   ??? Drug use: Never   ??? Sexual activity: Not Currently       Family Hx:  No family history on file.      Review of Systems:       General: Denies headache, lethargy, fever, weight loss  Ears/Nose/Throat: Denies ear discharge, drainage, nosebleeds, hoarse voice, dental problems  Cardiovascular: Denies chest pain, shortness of breath  Lungs: Denies chest pain, breathing problems, wheezing, pneumonia  Stomach: Denies stomach pain, heartburn, constipation, irritable bowel  Skin: Denies rash, sores, open wounds  Musculoskeletal: Low back pain  Genitourinary: Denies dysuria, hematuria, polyuria  Gastrointestinal: Denies constipation, obstipation, diarrhea  Neurological: Denies changes in sight, smell, hearing, taste, seizures. Denies loss of  consciousness.  Psychiatric: Denies depression, sleep pattern changes, anxiety, change in personality  Endocrine: Denies mood swings, heat or cold intolerance  Hematologic/Lymphatic: Denies anemia, purpura, petechia  Allergic/Immunologic: Denies swelling of throat, pain or swelling at lymph nodes      Physical Examination:    Visit Vitals  BP 138/89 (BP 1 Location: Right arm, BP Patient Position: Sitting)   Pulse 75   Temp 98.3 ??F (36.8 ??C) (Tympanic)   Ht 5\' 5"  (1.651 m)   Wt 148 lb (67.1 kg)   SpO2 99%   BMI 24.63 kg/m??        General: AOX3, no apparent distress  Psychiatric: mood and affect appropriate  Lungs: breathing is symmetric and unlabored bilaterally  Heart: regular rate and rhythm  Abdomen: no guarding  Head: normocephalic, atraumatic  Skin: No significant abnormalities, good turgor  Sensation intact to light touch: C5-T1 dermatomes  Muscular exam: 5/5 strength in all major muscle groups unless noted in specialty exam.    Extremities      Right upper extremity: No exam  Left upper extremity: No exam  Right lower extremity: Extremity is examined, no evidence of gross deformity, no gross motor or sensory  deficit.  Full active and passive range of motion is noted.  Muscle tone and bulk is within normal expected limits.  Capillary refill is less than 2 seconds distally.  Left lower extremity: Extremity is examined, no evidence of gross deformity, no gross motor or sensory deficit.  Full active and passive range of motion is noted.  Muscle tone and bulk is within normal expected limits.  Capillary refill is less than 2 seconds distally.      Diagnostics:    Pertinent Diagnostics: None today    Assessment: Osteoarthritis, lumbar spine  Plan:    This patient I had a long discussion regarding her treatment options, she could continue to stay with her physical therapy which seems to be working for her hip, additionally, plan will be to refer her out for pain control with possible epidural steroid injections going  forward.  She stated her understanding and satisfaction, follow-up will be in approximately 6 weeks for repeat clinical check.      [name] has a reminder for a "due or due soon" health maintenance. I have asked that she contact her primary care provider for follow-up on this health maintenance.

## 2019-06-20 ENCOUNTER — Encounter: Payer: MEDICARE | Attending: Rehabilitative and Restorative Service Providers"

## 2019-06-24 ENCOUNTER — Inpatient Hospital Stay: Admit: 2019-06-24 | Payer: MEDICARE | Attending: Rehabilitative and Restorative Service Providers"

## 2019-06-24 NOTE — Progress Notes (Signed)
PT DAILY TREATMENT NOTE - MCR 2-15    Patient Name: Caroline Shaw  Date:06/24/2019  DOB: 06-08-1946  [x]   Patient DOB Verified  [place]or: [place] MED[place] / Plan: [place] MED[place]ARE PART A & B / Product Type: Medicare /    In time:935  Out time:1032  Total Treatment Time (min): 57  Total Timed Codes (min): 47  1:1 Treatment Time ([facility] only): 58   Visit #:  5    Treatment Area: Right hip pain [M25.551]    SUBJECTIVE  Pain Level (0-10 scale): 1  Any medication changes, allergies to medications, adverse drug reactions, diagnosis change, or new procedure performed?: [x]  No    []  Yes (see summary sheet for update)  Subjective functional status/changes:   []  No changes reported  Pt reports that she has been doing better    OBJECTIVE    Modality rationale: decrease inflammation and decrease pain to improve the patient???s ability to complete all activity   Min Type Additional Details                       10 []   Ice     [x]   Heat  []   Ice massage Position:supine  Location:low back     [x]  Skin assessment post-treatment:  [x] intact [x] redness- no adverse reaction    [] redness ??? adverse reaction:     47 min Therapeutic Exercise:  [x]  See flow sheet :   Rationale: increase ROM, increase strength and improve coordination to improve the patient???s ability to complete all activity    Other Objective/Functional Measures:      Pain Level (0-10 scale) post treatment: 1-2    ASSESSMENT/Changes in Function:   Pt is able to complete all activity she is challenge with birddog and pull down step up as it was difficult for her balance.  Will continue to work on stability and balance to address her core and ability to complete all activity    Patient will continue to benefit from skilled PT services to modify and progress therapeutic interventions, address functional mobility deficits, address ROM deficits, address strength deficits, analyze and address soft tissue restrictions, analyze and cue movement patterns, analyze and modify  body mechanics/ergonomics, assess and modify postural abnormalities and address imbalance/dizziness to attain remaining goals.     []   See Plan of Care  []   See progress note/recertification  []   See Discharge Summary         PLAN  [x]   Upgrade activities as tolerated     [x]   Continue plan of care  [x]   Update interventions per flow sheet       []   Discharge due to:_  []   Other:_      Wilson Singer, PT 06/24/2019

## 2019-06-24 NOTE — Progress Notes (Signed)
 PT DAILY TREATMENT NOTE - MCR 2-15    Patient Name: Caroline Shaw  Date:06/24/2019  DOB: Aug 21, 1946  [x]   Patient DOB Verified  Payor: [place] MEDICARE / Plan: [place] MEDICARE PART A & B / Product Type: Medicare /    In time:935  Out time:1032  Total Treatment Time (min): 57  Total Timed Codes (min): 47  1:1 Treatment Time ([facility] only): 47   Visit #:  5    Treatment Area: Right hip pain [M25.551]    SUBJECTIVE  Pain Level (0-10 scale): 1  Any medication changes, allergies to medications, adverse drug reactions, diagnosis change, or new procedure performed?: [x]  No    []  Yes (see summary sheet for update)  Subjective functional status/changes:   []  No changes reported  Pt reports that she has been doing better    OBJECTIVE    Modality rationale: decrease inflammation and decrease pain to improve the patient's ability to complete all activity   Min Type Additional Details                       10 []   Ice     [x]   Heat  []   Ice massage Position:supine  Location:low back     [x]  Skin assessment post-treatment:  [x] intact [x] redness- no adverse reaction    [] redness - adverse reaction:     47 min Therapeutic Exercise:  [x]  See flow sheet :   Rationale: increase ROM, increase strength and improve coordination to improve the patient's ability to complete all activity    Other Objective/Functional Measures:      Pain Level (0-10 scale) post treatment: 1-2    ASSESSMENT/Changes in Function:   Pt is able to complete all activity she is challenge with birddog and pull down step up as it was difficult for her balance.  Will continue to work on stability and balance to address her core and ability to complete all activity    Patient will continue to benefit from skilled PT services to modify and progress therapeutic interventions, address functional mobility deficits, address ROM deficits, address strength deficits, analyze and address soft tissue restrictions, analyze and cue movement patterns, analyze and modify body  mechanics/ergonomics, assess and modify postural abnormalities and address imbalance/dizziness to attain remaining goals.     []   See Plan of Care  []   See progress note/recertification  []   See Discharge Summary         PLAN  [x]   Upgrade activities as tolerated     [x]   Continue plan of care  [x]   Update interventions per flow sheet       []   Discharge due to:_  []   Other:_      Alm CHRISTELLA Ada, PT 06/24/2019

## 2019-06-26 ENCOUNTER — Inpatient Hospital Stay: Admit: 2019-06-26 | Payer: MEDICARE | Attending: Rehabilitative and Restorative Service Providers"

## 2019-06-26 NOTE — Progress Notes (Signed)
PT DAILY TREATMENT NOTE - MCR 2-15    Patient Name: Caroline Shaw  Date:06/26/2019  DOB: 01/31/1946  [x]   Patient DOB Verified  Payor: [place] MEDICARE / Plan: [place] MEDICARE PART A & B / Product Type: Medicare /    In time:1030 Out time:1119  Total Treatment Time (min): 49  Total Timed Codes (min): 39  1:1 Treatment Time ([place] only): 77   Visit #:  6    Treatment Area: Right hip pain [M25.551]    SUBJECTIVE  Pain Level (0-10 scale): 0  Any medication changes, allergies to medications, adverse drug reactions, diagnosis change, or new procedure performed?: [x]  No    []  Yes (see summary sheet for update)  Subjective functional status/changes:   []  No changes reported  Pt reports that she has been doing better    OBJECTIVE    Modality rationale: decrease inflammation and decrease pain to improve the patient???s ability to complete all activity   Min Type Additional Details                       10 []   Ice     [x]   Heat  []   Ice massage Position:supine  Location:low back     [x]  Skin assessment post-treatment:  [x] intact [x] redness- no adverse reaction    [] redness ??? adverse reaction:     39 min Therapeutic Exercise:  [x]  See flow sheet :   Rationale: increase ROM, increase strength and improve coordination to improve the patient???s ability to complete all activity    Other Objective/Functional Measures:      Pain Level (0-10 scale) post treatment: 1-2    ASSESSMENT/Changes in Function:   Pt is progressing nicely.  She does complain of the hip grabbing following there-ex today.  Attempted to complete hip hike in standing but she had increased hip pain.  Will continue to monitor and progress as able 1x/wk    Patient will continue to benefit from skilled PT services to modify and progress therapeutic interventions, address functional mobility deficits, address ROM deficits, address strength deficits, analyze and address soft tissue restrictions, analyze and cue movement patterns, analyze and modify  body mechanics/ergonomics, assess and modify postural abnormalities and address imbalance/dizziness to attain remaining goals.     []   See Plan of Care  []   See progress note/recertification  []   See Discharge Summary         PLAN  [x]   Upgrade activities as tolerated     [x]   Continue plan of care  [x]   Update interventions per flow sheet       []   Discharge due to:_  []   Other:_      Wilson Singer, PT 06/26/2019

## 2019-06-26 NOTE — Progress Notes (Signed)
 PT DAILY TREATMENT NOTE - MCR 2-15    Patient Name: Caroline Shaw  Date:06/26/2019  DOB: [birth year]  [x]   Patient DOB Verified  Payor: [place] MEDICARE / Plan: [place] MEDICARE PART A & B / Product Type: Medicare /    In time:1030 Out time:1119  Total Treatment Time (min): 49  Total Timed Codes (min): 39  1:1 Treatment Time ([facility] only): 39   Visit #:  6    Treatment Area: Right hip pain [M25.551]    SUBJECTIVE  Pain Level (0-10 scale): 0  Any medication changes, allergies to medications, adverse drug reactions, diagnosis change, or new procedure performed?: [x]  No    []  Yes (see summary sheet for update)  Subjective functional status/changes:   []  No changes reported  Pt reports that she has been doing better    OBJECTIVE    Modality rationale: decrease inflammation and decrease pain to improve the patient's ability to complete all activity   Min Type Additional Details                       10 []   Ice     [x]   Heat  []   Ice massage Position:supine  Location:low back     [x]  Skin assessment post-treatment:  [x] intact [x] redness- no adverse reaction    [] redness - adverse reaction:     39 min Therapeutic Exercise:  [x]  See flow sheet :   Rationale: increase ROM, increase strength and improve coordination to improve the patient's ability to complete all activity    Other Objective/Functional Measures:      Pain Level (0-10 scale) post treatment: 1-2    ASSESSMENT/Changes in Function:   Pt is progressing nicely.  She does complain of the hip grabbing following there-ex today.  Attempted to complete hip hike in standing but she had increased hip pain.  Will continue to monitor and progress as able 1x/wk    Patient will continue to benefit from skilled PT services to modify and progress therapeutic interventions, address functional mobility deficits, address ROM deficits, address strength deficits, analyze and address soft tissue restrictions, analyze and cue movement patterns, analyze and modify body mechanics/ergonomics,  assess and modify postural abnormalities and address imbalance/dizziness to attain remaining goals.     []   See Plan of Care  []   See progress note/recertification  []   See Discharge Summary         PLAN  [x]   Upgrade activities as tolerated     [x]   Continue plan of care  [x]   Update interventions per flow sheet       []   Discharge due to:_  []   Other:_      Alm CHRISTELLA Ada, PT 06/26/2019

## 2019-06-30 ENCOUNTER — Inpatient Hospital Stay: Admit: 2019-06-30 | Payer: MEDICARE | Attending: Rehabilitative and Restorative Service Providers"

## 2019-06-30 NOTE — Progress Notes (Signed)
PT DAILY TREATMENT NOTE - MCR 2-15    Patient Name: Caroline Shaw  Date:06/30/2019  DOB: [birth year]  [x]   Patient DOB Verified  Payor: [place] MEDICARE / Plan: [place] MEDICARE PART A & B / Product Type: Medicare /    In NOMV:[id number] Out time:1238  Total Treatment Time (min): 39  Total Timed Codes (min): 39  1:1 Treatment Time ([place] only): 54   Visit #:  7    Treatment Area: Right hip pain [M25.551]    SUBJECTIVE  Pain Level (0-10 scale): 4  Any medication changes, allergies to medications, adverse drug reactions, diagnosis change, or new procedure performed?: [x]  No    []  Yes (see summary sheet for update)  Subjective functional status/changes:   []  No changes reported  Pt reports that she is doing ok but sore from packing    OBJECTIVE    Modality rationale: decrease inflammation and decrease pain to improve the patient???s ability to complete all activity   Min Type Additional Details                        []   Ice     [x]   Heat  []   Ice massage Position:supine  Location:low back     [x]  Skin assessment post-treatment:  [x] intact [x] redness- no adverse reaction    [] redness ??? adverse reaction:     39 min Therapeutic Exercise:  [x]  See flow sheet :   Rationale: increase ROM, increase strength and improve coordination to improve the patient???s ability to complete all activity    Other Objective/Functional Measures:      Pain Level (0-10 scale) post treatment: 1-2    ASSESSMENT/Changes in Function:   Pt is progressing well and was able to get some relief following treatment today.  She is progressing nicely and is able to tolerate more there-ex today.  Will continue to progress as able    Patient will continue to benefit from skilled PT services to modify and progress therapeutic interventions, address functional mobility deficits, address ROM deficits, address strength deficits, analyze and address soft tissue restrictions, analyze and cue movement patterns, analyze and modify  body mechanics/ergonomics, assess and modify postural abnormalities and address imbalance/dizziness to attain remaining goals.     []   See Plan of Care  []   See progress note/recertification  []   See Discharge Summary         PLAN  [x]   Upgrade activities as tolerated     [x]   Continue plan of care  [x]   Update interventions per flow sheet       []   Discharge due to:_  []   Other:_      Wilson Singer, PT 06/30/2019

## 2019-06-30 NOTE — Progress Notes (Signed)
 PT DAILY TREATMENT NOTE - MCR 2-15    Patient Name: Caroline Shaw  Date:06/30/2019  DOB: Apr 13, 1946  [x]   Patient DOB Verified  Payor: [place] MEDICARE / Plan: [place] MEDICARE PART A & B / Product Type: Medicare /    In time:1159 Out time:1238  Total Treatment Time (min): 39  Total Timed Codes (min): 39  1:1 Treatment Time ([facility] only): 39   Visit #:  7    Treatment Area: Right hip pain [M25.551]    SUBJECTIVE  Pain Level (0-10 scale): 4  Any medication changes, allergies to medications, adverse drug reactions, diagnosis change, or new procedure performed?: [x]  No    []  Yes (see summary sheet for update)  Subjective functional status/changes:   []  No changes reported  Pt reports that she is doing ok but sore from packing    OBJECTIVE    Modality rationale: decrease inflammation and decrease pain to improve the patient's ability to complete all activity   Min Type Additional Details                        []   Ice     [x]   Heat  []   Ice massage Position:supine  Location:low back     [x]  Skin assessment post-treatment:  [x] intact [x] redness- no adverse reaction    [] redness - adverse reaction:     39 min Therapeutic Exercise:  [x]  See flow sheet :   Rationale: increase ROM, increase strength and improve coordination to improve the patient's ability to complete all activity    Other Objective/Functional Measures:      Pain Level (0-10 scale) post treatment: 1-2    ASSESSMENT/Changes in Function:   Pt is progressing well and was able to get some relief following treatment today.  She is progressing nicely and is able to tolerate more there-ex today.  Will continue to progress as able    Patient will continue to benefit from skilled PT services to modify and progress therapeutic interventions, address functional mobility deficits, address ROM deficits, address strength deficits, analyze and address soft tissue restrictions, analyze and cue movement patterns, analyze and modify body mechanics/ergonomics, assess and modify  postural abnormalities and address imbalance/dizziness to attain remaining goals.     []   See Plan of Care  []   See progress note/recertification  []   See Discharge Summary         PLAN  [x]   Upgrade activities as tolerated     [x]   Continue plan of care  [x]   Update interventions per flow sheet       []   Discharge due to:_  []   Other:_      Alm CHRISTELLA Ada, PT 06/30/2019

## 2019-07-02 ENCOUNTER — Encounter: Payer: MEDICARE | Attending: Rehabilitative and Restorative Service Providers"

## 2019-07-08 ENCOUNTER — Inpatient Hospital Stay: Admit: 2019-07-08 | Payer: MEDICARE | Attending: Rehabilitative and Restorative Service Providers"

## 2019-07-08 NOTE — Progress Notes (Signed)
PT DAILY TREATMENT NOTE - MCR 2-15    Patient Name: Caroline Shaw  Date:07/08/2019  DOB: 08/30/1946  [x]  Patient DOB Verified  Payor: [place] MEDICARE / Plan: [place] MEDICARE PART A & B / Product Type: Medicare /    In time:120 Out time:255  Total Treatment Time (min): 35  Total Timed Codes (min): 35  1:1 Treatment Time ([facility] only): 30    Visit #:  8    Treatment Area: Right hip pain [M25.551]    SUBJECTIVE  Pain Level (0-10 scale): 4  Any medication changes, allergies to medications, adverse drug reactions, diagnosis change, or new procedure performed?: [x] No    [] Yes (see summary sheet for update)  Subjective functional status/changes:   [] No changes reported  Pt reports that she is doing well and feels that she is ready for DC    OBJECTIVE    30 min Therapeutic Exercise:  [x] See flow sheet :   Rationale: increase ROM, increase strength and improve coordination to improve the patient???s ability to complete all activity    Other Objective/Functional Measures: FOTO 70     Pain Level (0-10 scale) post treatment: 0-1    ASSESSMENT/Changes in Function:   Pt has done well and demonstrates independent with HEP.  At this time she has reached or is progressing toward all goals.  She will be DC'd to her HEP    Patient will continue to benefit from skilled PT services to modify and progress therapeutic interventions, address functional mobility deficits, address ROM deficits, address strength deficits, analyze and address soft tissue restrictions, analyze and cue movement patterns, analyze and modify body mechanics/ergonomics, assess and modify postural abnormalities and address imbalance/dizziness to attain remaining goals.     []  See Plan of Care  []  See progress note/recertification  []  See Discharge Summary         PLAN  [x]  Upgrade activities as tolerated     [x]  Continue plan of care  [x]  Update interventions per flow sheet       []  Discharge due to:_  []  Other:_      Abhiraj Dozal M Shanavia Makela, PT 07/08/2019

## 2019-07-08 NOTE — Progress Notes (Signed)
[facility]  [address] ([address] [place]  [place], [place]  Phone: [phone] Fax: [phone]    Discharge Summary 2-15    Patient name: Caroline Shaw  DOB: [birth year]  Provider#: [id number]  Referral source: Aaron Mose, DO      Medical/Treatment Diagnosis: Right hip pain [M25.551]     Prior Hospitalization: see medical history     Comorbidities: See Plan of Care  Prior Level of Function: See Plan of Care  Medications: Verified on Patient Summary List    Start of Care: 06/03/2019      Onset Date:chronic   Visits from Start of Care: 8     Missed Visits: 0  Reporting Period : 06/03/2019 to 07/08/2019    Assessment/Summary of care: Pt has completed 8 visits of therapy that have focused on restoring lumbar ROM and core stability.  She has progressed well and been very compliant with her HEP.  She is able to manage her bad days and feels that she is getting stronger.  She plans to continue to work on the HEP independently and has been encouraged to do so.  She will be DC'd at this time from skilled services    Goal:  Short Term Goals: To be accomplished in 8-10 treatments:  Pt will be I with HEP MET  Pt will complain of pain 0-1/10 with all activity MEt  Pt will increase ROM to complete all ADL's more efficiently MET  Pt will be able to maintain positions for 60 min without increased symptoms Progressing Toward  ??  Long Term Goals: To be accomplished in 14-16 treatments:              Pt will complain of pain 0/10 with all activity Progressing Toward  Pt will increase strength to maintain better lumbopelvic alignment MET  Pt will increase FOTO score by 6 points to meet MDC and improve their function MET  Pt will be able to complete all work tasks without increased symptoms Progressing Toward  ??        RECOMMENDATIONS:  [x] Discontinue therapy: [x] Patient has reached or is progressing toward set goals     [] Patient is non-compliant or has abdicated      [] Due to lack of appreciable progress towards set goals     [] Other  Wilson Singer, PT 07/14/2019

## 2019-07-08 NOTE — Progress Notes (Signed)
 PT DAILY TREATMENT NOTE - MCR 2-15    Patient Name: Caroline Shaw  Date:07/08/2019  DOB: [birth year]  [x]   Patient DOB Verified  [place]or: [place] / Plan: [place] MED[place] A & B / Product Type: Medicare /    In time:120 Out time:255  Total Treatment Time (min): 35  Total Timed Codes (min): 35  1:1 Treatment Time (MC only): 30    Visit #:  8    Treatment Area: Right hip pain [M25.551]    SUBJECTIVE  Pain Level (0-10 scale): 4  Any medication changes, allergies to medications, adverse drug reactions, diagnosis change, or new procedure performed?: [x]  No    []  Yes (see summary sheet for update)  Subjective functional status/changes:   []  No changes reported  Pt reports that she is doing well and feels that she is ready for DC    OBJECTIVE    30 min Therapeutic Exercise:  [x]  See flow sheet :   Rationale: increase ROM, increase strength and improve coordination to improve the patient's ability to complete all activity    Other Objective/Functional Measures: FOTO 70     Pain Level (0-10 scale) post treatment: 0-1    ASSESSMENT/Changes in Function:   Pt has done well and demonstrates independent with HEP.  At this time she has reached or is progressing toward all goals.  She will be DC'd to her HEP    Patient will continue to benefit from skilled PT services to modify and progress therapeutic interventions, address functional mobility deficits, address ROM deficits, address strength deficits, analyze and address soft tissue restrictions, analyze and cue movement patterns, analyze and modify body mechanics/ergonomics, assess and modify postural abnormalities and address imbalance/dizziness to attain remaining goals.     []   See Plan of Care  []   See progress note/recertification  []   See Discharge Summary         PLAN  [x]   Upgrade activities as tolerated     [x]   Continue plan of care  [x]   Update interventions per flow sheet       []   Discharge due to:_  []   Other:_      Alm CHRISTELLA Ada, PT 07/08/2019

## 2019-07-08 NOTE — Progress Notes (Signed)
 [facility]  [address] ([address]  [place], [place]  [address]  Phone: [phone] Fax: [phone]    Discharge Summary 2-15    Patient name: Caroline Shaw  DOB: [birth year]  Provider#: [id number]  Referral source: Budny, Jacob M, DO      Medical/Treatment Diagnosis: Right hip pain [M25.551]     Prior Hospitalization: see medical history     Comorbidities: See Plan of Care  Prior Level of Function: See Plan of Care  Medications: Verified on Patient Summary List    Start of Care: 06/03/2019      Onset Date:chronic   Visits from Start of Care: 8     Missed Visits: 0  Reporting Period : 06/03/2019 to 07/08/2019    Assessment/Summary of care: Pt has completed 8 visits of therapy that have focused on restoring lumbar ROM and core stability.  She has progressed well and been very compliant with her HEP.  She is able to manage her bad days and feels that she is getting stronger.  She plans to continue to work on the HEP independently and has been encouraged to do so.  She will be DC'd at this time from skilled services    Goal:  Short Term Goals: To be accomplished in 8-10 treatments:  Pt will be I with HEP MET  Pt will complain of pain 0-1/10 with all activity MEt  Pt will increase ROM to complete all ADL's more efficiently MET  Pt will be able to maintain positions for 60 min without increased symptoms Progressing Toward    Long Term Goals: To be accomplished in 14-16 treatments:              Pt will complain of pain 0/10 with all activity Progressing Toward  Pt will increase strength to maintain better lumbopelvic alignment MET  Pt will increase FOTO score by 6 points to meet MDC and improve their function MET  Pt will be able to complete all work tasks without increased symptoms Progressing Toward          RECOMMENDATIONS:  [x] Discontinue therapy: [x] Patient has reached or is progressing toward set goals     [] Patient is non-compliant or has abdicated     [] Due to  lack of appreciable progress towards set goals     [] Other  Alm CHRISTELLA Ada, PT 07/14/2019

## 2019-07-15 ENCOUNTER — Encounter: Attending: Rehabilitative and Restorative Service Providers"
